# Patient Record
Sex: Male | Born: 2000 | Race: White | Hispanic: No | Marital: Single | Smoking: Never smoker
Health system: Southern US, Community
[De-identification: ages and names within clinical notes are randomized; demographics above are authoritative.]

## PROBLEM LIST (undated history)

## (undated) DIAGNOSIS — F840 Autistic disorder: Secondary | ICD-10-CM

## (undated) DIAGNOSIS — J302 Other seasonal allergic rhinitis: Secondary | ICD-10-CM

## (undated) DIAGNOSIS — F902 Attention-deficit hyperactivity disorder, combined type: Secondary | ICD-10-CM

## (undated) HISTORY — DX: Attention-deficit hyperactivity disorder, combined type: F90.2

## (undated) HISTORY — DX: Autistic disorder: F84.0

## (undated) HISTORY — DX: Other seasonal allergic rhinitis: J30.2

---
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
## 2019-01-11 DIAGNOSIS — L700 Acne vulgaris: Secondary | ICD-10-CM | POA: Diagnosis not present

## 2019-01-13 ENCOUNTER — Other Ambulatory Visit: Payer: Self-pay

## 2019-01-13 NOTE — Telephone Encounter (Signed)
pts mom left v/m (DPR signed) requesting refill vyvanse 70mg    Name of Pharmacy: [facility] [address] Last Fill or Written Date and Quantity: # 30 on 12/14/18 Last Office Visit and Type: 09/04/18 ADHD Next Office Visit and Type:none scheduled  Last Controlled Substance Agreement Date: 02/23/18 Last UDS:02/18/18

## 2019-01-14 MED ORDER — VYVANSE 70 MG PO CAPS: 70.0000 mg | ORAL_CAPSULE | Freq: Every day | ORAL | 0 refills | Status: DC

## 2019-01-14 NOTE — Telephone Encounter (Signed)
Eprescribed.

## 2019-02-10 ENCOUNTER — Other Ambulatory Visit: Payer: Self-pay

## 2019-02-10 NOTE — Telephone Encounter (Signed)
Name of Medication: Vyvanse 70 mg Name of Pharmacy:[facility]  Last Fill or Written Date and Quantity:# 32 on 01/14/19  Last Office Visit and Type:09/04/18 ADHD  Next Office Visit and Type: none scheduled Last Controlled Substance Agreement Date: 02/23/18 Last UDS:02/18/2018

## 2019-02-11 MED ORDER — VYVANSE 70 MG PO CAPS: 70.0000 mg | ORAL_CAPSULE | Freq: Every day | ORAL | 0 refills | Status: DC

## 2019-02-11 NOTE — Telephone Encounter (Signed)
Eprescribed.

## 2019-03-17 ENCOUNTER — Other Ambulatory Visit: Payer: Self-pay

## 2019-03-17 NOTE — Telephone Encounter (Signed)
Name of Medication:adderall 20 mg  Name of Pharmacy: [facility] Last Fill or Written Date and Quantity:# 30 on 11/12/18  Last Office Visit and Type: 09/04/18 ADHD Next Office Visit and Type: none Last Controlled Substance Agreement Date:02/23/2018  Last UDS:02/18/2018   Name of Medication : vyvanse 70 mg  Name of [facility]acy: [facility] Last Fill or Written Date and Quantity:# 30 on 02/11/19  Last Office Visit and Type: 09/04/18 ADHD Next Office Visit and Type: none Last Controlled Substance Agreement Date:02/23/2018  Last UDS:02/18/2018

## 2019-03-19 MED ORDER — VYVANSE 70 MG PO CAPS: 70.0000 mg | ORAL_CAPSULE | Freq: Every day | ORAL | 0 refills | Status: DC

## 2019-03-19 MED ORDER — AMPHETAMINE-DEXTROAMPHETAMINE 20 MG PO TABS: 20.0000 mg | ORAL_TABLET | Freq: Every day | ORAL | 0 refills | Status: DC

## 2019-03-19 NOTE — Telephone Encounter (Signed)
Pt's mom called requesting update on this. States patient goes back to school on Tuesday and he is out of medications.

## 2019-03-19 NOTE — Telephone Encounter (Signed)
Best number [phone] Hinton Dyer (mom) called checking on refill Pt is out of meds and starts school next week  [facility] [address]

## 2019-03-19 NOTE — Telephone Encounter (Signed)
Please call mom when this has been called

## 2019-03-19 NOTE — Telephone Encounter (Signed)
Eprescribed.

## 2019-04-19 ENCOUNTER — Other Ambulatory Visit: Payer: Self-pay | Admitting: *Deleted

## 2019-04-19 NOTE — Telephone Encounter (Signed)
Patient's mom left a voicemail requesting refills   Name of Medication: Adderall, Vyvanse Name of Pharmacy: [facility] [place] or Written Date and Quantity: both 03/19/19 #30 Last Office Visit and Type: 09/04/18 Next Office Visit and Type: None scheduled Last Controlled Substance Agreement Date: 02/23/18 Last UDS:02/18/18

## 2019-04-21 MED ORDER — AMPHETAMINE-DEXTROAMPHETAMINE 20 MG PO TABS: 20.0000 mg | ORAL_TABLET | Freq: Every day | ORAL | 0 refills | Status: DC

## 2019-04-21 MED ORDER — VYVANSE 70 MG PO CAPS: 70.0000 mg | ORAL_CAPSULE | Freq: Every day | ORAL | 0 refills | Status: DC

## 2019-04-21 NOTE — Telephone Encounter (Signed)
ERx 

## 2019-05-17 ENCOUNTER — Other Ambulatory Visit: Payer: Self-pay | Admitting: *Deleted

## 2019-05-17 NOTE — Telephone Encounter (Signed)
Patient's mom left a voicemail requesting a refill on Vyvanne.  Name of Medication: Vyvanne Name of Pharmacy: [facility] or Written Date and Quantity: 04/21/19 #30 Last Office Visit and Type: 09/04/18 Next Office Visit and Type: None scheduled Last Controlled Substance Agreement Date: 02/23/18 Last UDS: 02/18/18

## 2019-05-19 MED ORDER — AMPHETAMINE-DEXTROAMPHETAMINE 20 MG PO TABS: 20.0000 mg | ORAL_TABLET | Freq: Every day | ORAL | 0 refills | Status: DC

## 2019-05-19 NOTE — Telephone Encounter (Signed)
ERx 

## 2019-05-21 ENCOUNTER — Other Ambulatory Visit: Payer: Self-pay

## 2019-05-21 MED ORDER — VYVANSE 70 MG PO CAPS: 70.0000 mg | ORAL_CAPSULE | Freq: Every day | ORAL | 0 refills | Status: DC

## 2019-05-21 NOTE — Telephone Encounter (Signed)
ERx 

## 2019-05-21 NOTE — Telephone Encounter (Signed)
Name of Medication: Vyvanse Name of Pharmacy: [facility] Last Fill or Written Date and Quantity: 04/21/19, #30 Last Office Visit and Type: 09/04/18, ADHD f/u Next Office Visit and Type: none Last Controlled Substance Agreement Date: 02/21/18 Last UDS: 02/21/18

## 2019-06-02 DIAGNOSIS — Z20828 Contact with and (suspected) exposure to other viral communicable diseases: Secondary | ICD-10-CM | POA: Diagnosis not present

## 2019-06-18 ENCOUNTER — Other Ambulatory Visit: Payer: Self-pay

## 2019-06-18 MED ORDER — VYVANSE 70 MG PO CAPS: 70.0000 mg | ORAL_CAPSULE | Freq: Every day | ORAL | 0 refills | Status: DC

## 2019-06-18 NOTE — Telephone Encounter (Signed)
Name of Medication:Vyvanse 70 mg  Name of Pharmacy: [facility] or Written Date and Quantity: # 30 on 05/21/19 Last Office Visit and Type: 09/04/18 ADHD Next Office Visit and Type: none scheduled Last Controlled Substance Agreement Date: 02/23/2018 Last UDS:02/18/2018

## 2019-06-18 NOTE — Telephone Encounter (Signed)
ERx 

## 2019-07-12 ENCOUNTER — Other Ambulatory Visit: Payer: Self-pay

## 2019-07-12 NOTE — Telephone Encounter (Signed)
Name of Medication: Vyvanse 70 mg Name of Pharmacy: [facility] Last Fill or Written Date and Quantity: # 30 on 06/18/19 Last Office Visit and Type: 09/04/18 ADHD Next Office Visit and Type: none scheduled Last Controlled Substance Agreement Date: 02/23/2018 Last UDS:02/18/2018

## 2019-07-13 MED ORDER — VYVANSE 70 MG PO CAPS: 70.0000 mg | ORAL_CAPSULE | Freq: Every day | ORAL | 0 refills | Status: DC

## 2019-07-13 NOTE — Telephone Encounter (Signed)
ERx 

## 2019-08-16 ENCOUNTER — Other Ambulatory Visit: Payer: Self-pay | Admitting: *Deleted

## 2019-08-16 NOTE — Telephone Encounter (Signed)
Patient's mom left a voicemail requesting a refill  Name of Medication: Vyvanse Name of Pharmacy: [facility] Last Fill or Written Date and Quantity: 07/13/19 #30 Last Office Visit and Type: 09/04/18 Next Office Visit and Type: none scheduled Last Controlled Substance Agreement Date: 02/23/18 Last UDS:02/18/18

## 2019-08-18 MED ORDER — VYVANSE 70 MG PO CAPS: 70.0000 mg | ORAL_CAPSULE | Freq: Every day | ORAL | 0 refills | Status: DC

## 2019-08-18 NOTE — Telephone Encounter (Signed)
ERx 

## 2019-09-20 ENCOUNTER — Other Ambulatory Visit: Payer: Self-pay | Admitting: *Deleted

## 2019-09-20 MED ORDER — VYVANSE 70 MG PO CAPS: 70.0000 mg | ORAL_CAPSULE | Freq: Every day | ORAL | 0 refills | Status: DC

## 2019-09-20 NOTE — Telephone Encounter (Signed)
ERx 

## 2019-09-20 NOTE — Telephone Encounter (Signed)
Patient's mom left a voicemail requesting a refill on Vyvanse Name of Medication: Vyvanse Name of Pharmacy: [facility] or Written Date and Quantity: 08/18/19 Last Office Visit and Type: 09/04/18 Next Office Visit and Type: none scheduled Last Controlled Substance Agreement Date: 02/23/18 Last UDS:02/18/18

## 2019-10-22 ENCOUNTER — Other Ambulatory Visit: Payer: Self-pay

## 2019-10-22 MED ORDER — VYVANSE 70 MG PO CAPS: 70.0000 mg | ORAL_CAPSULE | Freq: Every day | ORAL | 0 refills | Status: DC

## 2019-10-22 MED ORDER — AMPHETAMINE-DEXTROAMPHETAMINE 20 MG PO TABS: 20.0000 mg | ORAL_TABLET | Freq: Every day | ORAL | 0 refills | Status: DC

## 2019-10-22 NOTE — Telephone Encounter (Signed)
Name of Medication: Vyvanse 70 mg [facility] of [facility]  Last Fill or Written Date and Quantity:#30 on 09/20/19  Last Office Visit and Type: 09/05/19 ADHD Next Office Visit and Type: none scheduled Last Controlled Substance Agreement Date: 02/23/2018 Last UDS:02/18/2018   Name of Medication: Adderall 20 mg [facility]  Last Fill or Written Date and Quantity:#30 on 05/19/19  Last Office Visit and Type: 09/05/19 ADHD Next Office Visit and Type: none scheduled Last Controlled Substance Agreement Date: 02/23/2018 Last UDS:02/18/2018  pts mom (DPR signed) left v/m pt has couple of pills left.

## 2019-10-22 NOTE — Telephone Encounter (Signed)
ERx 

## 2019-11-22 ENCOUNTER — Other Ambulatory Visit: Payer: Self-pay

## 2019-11-22 MED ORDER — VYVANSE 70 MG PO CAPS: 70.0000 mg | ORAL_CAPSULE | Freq: Every day | ORAL | 0 refills | Status: DC

## 2019-11-22 NOTE — Telephone Encounter (Signed)
Name of Medication: Vyvanse 70 mg Name [facility]  Last Fill or Written Date and Quantity: # 30 on 10/22/19 Last Office Visit and Type: 09/04/2018 ADHD Next Office Visit and Type: none scheduled Last Controlled Substance Agreement Date: 02/23/2018 Last UDS:02/18/2018

## 2019-11-22 NOTE — Telephone Encounter (Signed)
ERx 

## 2019-11-29 ENCOUNTER — Encounter: Payer: Self-pay | Admitting: Podiatry

## 2019-11-29 ENCOUNTER — Other Ambulatory Visit: Payer: Self-pay | Admitting: Podiatry

## 2019-11-29 ENCOUNTER — Other Ambulatory Visit: Payer: Self-pay

## 2019-11-29 ENCOUNTER — Ambulatory Visit: Payer: BC Managed Care – PPO | Admitting: Podiatry

## 2019-11-29 ENCOUNTER — Ambulatory Visit (INDEPENDENT_AMBULATORY_CARE_PROVIDER_SITE_OTHER): Payer: BC Managed Care – PPO

## 2019-11-29 VITALS — Temp 98.2°F

## 2019-11-29 DIAGNOSIS — M2041 Other hammer toe(s) (acquired), right foot: Secondary | ICD-10-CM

## 2019-11-29 DIAGNOSIS — M79675 Pain in left toe(s): Secondary | ICD-10-CM | POA: Diagnosis not present

## 2019-11-29 DIAGNOSIS — D169 Benign neoplasm of bone and articular cartilage, unspecified: Secondary | ICD-10-CM

## 2019-11-29 DIAGNOSIS — M79674 Pain in right toe(s): Secondary | ICD-10-CM

## 2019-11-29 NOTE — Patient Instructions (Signed)
Pre-Operative Instructions  Congratulations, you have decided to take an important step towards improving your quality of life.  You can be assured that the doctors and staff at [facility] will be with you every step of the way.  Here are some important things you should know:  1. Plan to be at the surgery center/hospital at least 1 (one) hour prior to your scheduled time, unless otherwise directed by the surgical center/hospital staff.  You must have a responsible adult accompany you, remain during the surgery and drive you home.  Make sure you have directions to the surgical center/hospital to ensure you arrive on time. 2. If you are having surgery at [facility] or [facility], you will need a copy of your medical history and physical form from your family physician within one month prior to the date of surgery. We will give you a form for your primary physician to complete.  3. We make every effort to accommodate the date you request for surgery.  However, there are times where surgery dates or times have to be moved.  We will contact you as soon as possible if a change in schedule is required.   4. No aspirin/ibuprofen for one week before surgery.  If you are on aspirin, any non-steroidal anti-inflammatory medications (Mobic, Aleve, Ibuprofen) should not be taken seven (7) days prior to your surgery.  You make take Tylenol for pain prior to surgery.  5. Medications - If you are taking daily heart and blood pressure medications, seizure, reflux, allergy, asthma, anxiety, pain or diabetes medications, make sure you notify the surgery center/hospital before the day of surgery so they can tell you which medications you should take or avoid the day of surgery. 6. No food or drink after midnight the night before surgery unless directed otherwise by surgical center/hospital staff. 7. No alcoholic beverages 24-hours prior to surgery.  No smoking 24-hours prior or 24-hours after  surgery. 8. Wear loose pants or shorts. They should be loose enough to fit over bandages, boots, and casts. 9. Don't wear slip-on shoes. Sneakers are preferred. 10. Bring your boot with you to the surgery center/hospital.  Also bring crutches or a walker if your physician has prescribed it for you.  If you do not have this equipment, it will be provided for you after surgery. 11. If you have not been contacted by the surgery center/hospital by the day before your surgery, call to confirm the date and time of your surgery. 12. Leave-time from work may vary depending on the type of surgery you have.  Appropriate arrangements should be made prior to surgery with your employer. 13. Prescriptions will be provided immediately following surgery by your doctor.  Fill these as soon as possible after surgery and take the medication as directed. Pain medications will not be refilled on weekends and must be approved by the doctor. 14. Remove nail polish on the operative foot and avoid getting pedicures prior to surgery. 15. Wash the night before surgery.  The night before surgery wash the foot and leg well with water and the antibacterial soap provided. Be sure to pay special attention to beneath the toenails and in between the toes.  Wash for at least three (3) minutes. Rinse thoroughly with water and dry well with a towel.  Perform this wash unless told not to do so by your physician.  Enclosed: 1 Ice pack (please put in freezer the night before surgery)   1 Hibiclens skin cleaner     Pre-op instructions  If you have any questions regarding the instructions, please do not hesitate to call our office.  [employer]: [address], [place], [place] [address] -- [phone]  [place]: [address] [place], [place] [address] -- [phone]  [place]: [address], [place], [place] [address] -- [phone]   Website: [email or web address] 

## 2019-12-01 ENCOUNTER — Telehealth: Payer: Self-pay

## 2019-12-01 NOTE — Progress Notes (Signed)
Subjective:   Patient ID: Isaiah Benjamin, male   DOB: 19 y.o.   MRN: [id number]   HPI Patient presents with mother stating that he has several other toes that are really bothering him and the one we did surgery on is doing very well and he needs to have these other toes corrected like the other ones.  He states he is tried to trim them he is tried wider shoes and soaks without relief   Review of Systems  All other systems reviewed and are negative.       Objective:  Physical Exam Vitals and nursing note reviewed.  Constitutional:      Appearance: He is well-developed.  Pulmonary:     Effort: Pulmonary effort is normal.  Musculoskeletal:        General: Normal range of motion.  Skin:    General: Skin is warm.  Neurological:     Mental Status: He is alert.     Neurovascular status intact muscle strength adequate range of motion within normal limits.  Patient is found to have severe rotation digit for bilateral at the distal joint and distal rotation digit 5 right with exostotic distal lateral lesion that is painful when pressed.  Patient is found to have good digital perfusion well oriented x3     Assessment:  Chronic hammertoe deformity fourth bilateral fifth right with the fifth left having been fixed a number of years ago     Plan:  H&P reviewed conditions discussed at great length.  I recommended distal arthroplasties of 4 bilateral 5 right along with distal lateral exostectomy digit 5 right and I reviewed conditions and they are comfortable with procedure and want to get it done in 1 to review consent form today to get it done as soon as possible.  I allowed him to read consent form going over alternative treatments complications and they understand risk of there is no guarantee this will solve the problem and all other complications as outlined and patient is scheduled for outpatient surgery and is encouraged to call with questions concerns prior to procedure.   Patient is given all education and is scheduled for the surgery at this time  X-rays indicate significant rotation of the digits with distal deformity of digits 4 and 5 right 4 left

## 2019-12-01 NOTE — Telephone Encounter (Signed)
DOS 07/16/2019  HAMMERTOE REPAIR 4,5 RT & 4 LT - 28285 EXOSTECTOMY 5TH RT - 28108  [employer] EFFECTIVE DATE - 07/16/2019    In-Network   Max Per Benefit Period Year-to-Date Remaining     CoInsurance 30%       Deductible $2500.00 $0.00     Out-Of-Pocket 3 $8550.00 [id number]  Copay Not Applicable Coinsurance [id number]  per  Service Year Authorization Required No

## 2019-12-14 ENCOUNTER — Encounter: Payer: Self-pay | Admitting: Podiatry

## 2019-12-14 DIAGNOSIS — D1631 Benign neoplasm of short bones of right lower limb: Secondary | ICD-10-CM | POA: Diagnosis not present

## 2019-12-14 DIAGNOSIS — Z01818 Encounter for other preprocedural examination: Secondary | ICD-10-CM | POA: Diagnosis not present

## 2019-12-14 DIAGNOSIS — M2042 Other hammer toe(s) (acquired), left foot: Secondary | ICD-10-CM | POA: Diagnosis not present

## 2019-12-14 DIAGNOSIS — M898X7 Other specified disorders of bone, ankle and foot: Secondary | ICD-10-CM | POA: Diagnosis not present

## 2019-12-14 DIAGNOSIS — M2041 Other hammer toe(s) (acquired), right foot: Secondary | ICD-10-CM | POA: Diagnosis not present

## 2019-12-14 HISTORY — PX: HAMMER TOE SURGERY: SHX385

## 2019-12-20 ENCOUNTER — Other Ambulatory Visit: Payer: Self-pay | Admitting: *Deleted

## 2019-12-20 NOTE — Telephone Encounter (Signed)
Mother left message at Triage requesting refill of med:  Name of Medication: Vyvanse 70 mg Name [facility]  Last Fill or Written Date and Quantity: # 30 on 11/22/19 Last Office Visit and Type: 09/04/2018 ADHD Next Office Visit and Type: none scheduled Last Controlled Substance Agreement Date: 02/23/2018 Last UDS:02/18/2018

## 2019-12-21 MED ORDER — VYVANSE 70 MG PO CAPS: 70.0000 mg | ORAL_CAPSULE | Freq: Every day | ORAL | 0 refills | Status: DC

## 2019-12-21 NOTE — Telephone Encounter (Signed)
ERx 

## 2019-12-22 ENCOUNTER — Ambulatory Visit (INDEPENDENT_AMBULATORY_CARE_PROVIDER_SITE_OTHER): Payer: BC Managed Care – PPO

## 2019-12-22 ENCOUNTER — Other Ambulatory Visit: Payer: Self-pay

## 2019-12-22 ENCOUNTER — Ambulatory Visit (INDEPENDENT_AMBULATORY_CARE_PROVIDER_SITE_OTHER): Payer: BC Managed Care – PPO | Admitting: Podiatry

## 2019-12-22 ENCOUNTER — Other Ambulatory Visit: Payer: Self-pay | Admitting: Podiatry

## 2019-12-22 ENCOUNTER — Encounter: Payer: Self-pay | Admitting: Podiatry

## 2019-12-22 VITALS — Temp 97.3°F

## 2019-12-22 DIAGNOSIS — M2041 Other hammer toe(s) (acquired), right foot: Secondary | ICD-10-CM

## 2019-12-22 DIAGNOSIS — M2042 Other hammer toe(s) (acquired), left foot: Secondary | ICD-10-CM

## 2019-12-22 NOTE — Progress Notes (Signed)
Subjective:   Patient ID: Isaiah Benjamin, male   DOB: 19 y.o.   MRN: [id number]   HPI Patient presents stating he is doing very well with minimal discomfort and overall very pleased with results and presents with father today   ROS      Objective:  Physical Exam  Neurovascular status intact negative Bevelyn Buckles' sign noted wound edges well coapted in all digits 4 5 right 4 left with good alignment noted and good structural integrity with no drainage      Assessment:  Doing well post digital surgery 4 5 right 4 left     Plan:  H&P reviewed condition and reapplied sterile dressing reviewed x-rays and advised on continued elevation compression immobilization reappoint 2 weeks suture removal or earlier if needed  X-rays indicate satisfactory resection of bone no indications of pathology with good alignment of the digits noted

## 2020-01-10 ENCOUNTER — Other Ambulatory Visit: Payer: Self-pay

## 2020-01-10 ENCOUNTER — Ambulatory Visit (INDEPENDENT_AMBULATORY_CARE_PROVIDER_SITE_OTHER): Payer: BC Managed Care – PPO | Admitting: Podiatrist

## 2020-01-10 ENCOUNTER — Ambulatory Visit (INDEPENDENT_AMBULATORY_CARE_PROVIDER_SITE_OTHER): Payer: BC Managed Care – PPO

## 2020-01-10 ENCOUNTER — Encounter: Payer: Self-pay | Admitting: Podiatrist

## 2020-01-10 VITALS — Temp 95.2°F

## 2020-01-10 DIAGNOSIS — M2041 Other hammer toe(s) (acquired), right foot: Secondary | ICD-10-CM

## 2020-01-10 DIAGNOSIS — M2042 Other hammer toe(s) (acquired), left foot: Secondary | ICD-10-CM

## 2020-01-10 NOTE — Progress Notes (Signed)
  Chief Complaint  Patient presents with  . Routine Post Op    DOS 12/14/2019 - bilateral hammertoes. Pt stated, "Doing well. Still no pain. I've had more bruising in the R 5th toe. No bleeding. No injuries".     Subjective: Patient presents today1 month status post foot surgery of on both feet-  Hammertoe correction 4 bilateral and hammertoe with distal exostectomy-,5 right.   Patient denies nausea, vomiting, fevers, chills or night sweats.  Denies calf pain or tenderness. States he has had no pain and he is wearing his surgical shoes.  He presents with his father at todays visit.   Objective:  Neurovascular status is intact with palpable pedal pulses DP and PT at 2+ out of 4 both feet.  Negative homans sign noted.  Neurological sensation is intact and unchanged  Excellent appearance of the hammertoe repair x 3 is noted.  Incision sites are well coapted with sutures noted to be in place.  Dried hemorrhagic superficial tissue at the tip of the right fifth digit is stable and appears to be sloughing off with healthy, intact skin beneath.   Xrays show excellent alignment and position of hammertoe correction bilateral feet.    Assessment: Status post hammertoe correction bilateral dos 12/14/19  Plan:  Sutures removed today and he was instructed he may slowly wean out of the surgical shoe to supportive sneakers as tolerated.  Do not go barefoot or wear flip flops.  He was instructed to apply a dab of vaseline to the incision lines to help the keep the skin moist.  He will return in 4 weeks and will call sooner if questions arise.

## 2020-01-19 ENCOUNTER — Encounter: Payer: Self-pay | Admitting: Podiatry

## 2020-01-25 ENCOUNTER — Other Ambulatory Visit: Payer: Self-pay

## 2020-01-25 NOTE — Telephone Encounter (Signed)
Name of Medication: Vyvanse 70 mg Name of [facility]acy[facility]  Last Fill or Written Date and Quantity:# 30 on 12/21/19  Last Office Visit and Type: 09/04/18 ADHD Next Office Visit and Type:none scheduled

## 2020-01-28 ENCOUNTER — Telehealth: Payer: Self-pay

## 2020-01-28 MED ORDER — VYVANSE 70 MG PO CAPS: 70.0000 mg | ORAL_CAPSULE | Freq: Every day | ORAL | 0 refills | Status: DC

## 2020-01-28 NOTE — Telephone Encounter (Signed)
Patient mom came into office needing a refill on patients medication was told it was sent to pharmacy 2 days ago but wasn't  Please advise   VYVANSE 70 MG capsule

## 2020-01-28 NOTE — Telephone Encounter (Signed)
See Refill, 01/25/20.

## 2020-01-28 NOTE — Telephone Encounter (Signed)
ERx 

## 2020-01-28 NOTE — Telephone Encounter (Signed)
plz notify this was sent in. 

## 2020-01-31 NOTE — Telephone Encounter (Signed)
Spoke with pt's dad, Jeneen Rinks (on dpr), notifying him rx was sent to pharmacy.  Says they picked it up last week.

## 2020-02-02 ENCOUNTER — Other Ambulatory Visit: Payer: Self-pay

## 2020-02-02 ENCOUNTER — Ambulatory Visit (INDEPENDENT_AMBULATORY_CARE_PROVIDER_SITE_OTHER): Payer: BC Managed Care – PPO | Admitting: Podiatry

## 2020-02-02 ENCOUNTER — Encounter: Payer: Self-pay | Admitting: Podiatry

## 2020-02-02 ENCOUNTER — Ambulatory Visit (INDEPENDENT_AMBULATORY_CARE_PROVIDER_SITE_OTHER): Payer: BC Managed Care – PPO

## 2020-02-02 DIAGNOSIS — M2042 Other hammer toe(s) (acquired), left foot: Secondary | ICD-10-CM

## 2020-02-02 DIAGNOSIS — M2041 Other hammer toe(s) (acquired), right foot: Secondary | ICD-10-CM | POA: Diagnosis not present

## 2020-02-03 NOTE — Progress Notes (Signed)
Subjective:   Patient ID: Isaiah Benjamin, male   DOB: 19 y.o.   MRN: [id number]   HPI Patient states doing very well with surgery very happy and presents with father   ROS      Objective:  Physical Exam  Neurovascular status intact muscle strength adequate range of motion within normal limits with patient found to have good digital position and is found to have keratotic lesions which have resolved bilateral with no current pain mild swelling     Assessment:  Doing well post digital procedures fourth and fifth digits bilateral     Plan:  Final x-rays reviewed discussed return to normal shoe gear and activities but swelling should persist for another few months and is in normal part of the healing process  X-rays indicate satisfactory resection of bone no indications of regrowth or pathology

## 2020-02-21 ENCOUNTER — Other Ambulatory Visit: Payer: Self-pay

## 2020-02-21 NOTE — Telephone Encounter (Signed)
Name of Medication: Vyvanse 70 mg Name [facility]  Last Fill or Written Date and Quantity: # 30 on 01/28/20 Last Office Visit and Type: 09/04/2018 ADHD Next Office Visit and Type:none scheduled  Last Controlled Substance Agreement Date: 02/23/2018 Last UDS:02/18/2018

## 2020-02-22 MED ORDER — VYVANSE 70 MG PO CAPS: 70.0000 mg | ORAL_CAPSULE | Freq: Every day | ORAL | 0 refills | Status: DC

## 2020-02-22 NOTE — Telephone Encounter (Signed)
ERx 

## 2020-03-27 ENCOUNTER — Other Ambulatory Visit: Payer: Self-pay

## 2020-03-27 MED ORDER — VYVANSE 70 MG PO CAPS: 70.0000 mg | ORAL_CAPSULE | Freq: Every day | ORAL | 0 refills | Status: DC

## 2020-03-27 NOTE — Telephone Encounter (Signed)
Patient's mother contacted the office stating that patient needs a refill on his Vyvanse 70mg . Last refilled 02/22/20 for #30 with 0 refills.  Patient was last seen 09/04/18. OK to refill?

## 2020-03-27 NOTE — Telephone Encounter (Signed)
Erx

## 2020-04-07 DIAGNOSIS — H5213 Myopia, bilateral: Secondary | ICD-10-CM | POA: Diagnosis not present

## 2020-04-24 ENCOUNTER — Telehealth: Payer: Self-pay

## 2020-04-24 NOTE — Telephone Encounter (Signed)
Letter written and in chart 

## 2020-04-24 NOTE — Telephone Encounter (Signed)
Pt's mom called and left a message on triage on triage line asking if Dr Darnell Level would be willing to write a letter for the pt for [employer]. He received a summons. He has Autism and ADHD. Please advise at [phone]

## 2020-04-25 NOTE — Telephone Encounter (Signed)
Spoke with pt's dad, Jeneen Rinks (on dpr), notifying him the letter is ready to pick up.  He expresses his thanks and will pick up today.   [Printed and placed letter at front office- yellow folders.]

## 2020-04-27 ENCOUNTER — Other Ambulatory Visit: Payer: Self-pay

## 2020-04-27 MED ORDER — VYVANSE 70 MG PO CAPS: 70.0000 mg | ORAL_CAPSULE | Freq: Every day | ORAL | 0 refills | Status: DC

## 2020-04-27 NOTE — Telephone Encounter (Signed)
Name of Medication: Vyvanse Name of Pharmacy: [facility] [address]  Last Fill or Written Date and Quantity: 03/27/20, #30 Last Office Visit and Type: 09/04/18, ADHD f/u Next Office Visit and Type: none Last Controlled Substance Agreement Date: 02/21/18 Last UDS: 02/21/18

## 2020-04-27 NOTE — Telephone Encounter (Signed)
ERx 

## 2020-05-19 ENCOUNTER — Encounter: Payer: Self-pay | Admitting: Family Medicine

## 2020-05-19 ENCOUNTER — Ambulatory Visit (INDEPENDENT_AMBULATORY_CARE_PROVIDER_SITE_OTHER): Payer: BC Managed Care – PPO | Admitting: Family Medicine

## 2020-05-19 ENCOUNTER — Other Ambulatory Visit: Payer: Self-pay

## 2020-05-19 VITALS — BP 110/60 | HR 87 | Temp 98.1°F | Ht 67.0 in | Wt 129.0 lb

## 2020-05-19 DIAGNOSIS — F909 Attention-deficit hyperactivity disorder, unspecified type: Secondary | ICD-10-CM

## 2020-05-19 DIAGNOSIS — F840 Autistic disorder: Secondary | ICD-10-CM

## 2020-05-19 MED ORDER — AMPHETAMINE-DEXTROAMPHETAMINE 20 MG PO TABS: 20.0000 mg | ORAL_TABLET | Freq: Every day | ORAL | 0 refills | Status: DC

## 2020-05-19 NOTE — Assessment & Plan Note (Addendum)
Discussed stimulant dosing. On max dose vyvanse. he is taking vyvanse too early in the morning (6am) leading to early loss of effect. Suggested delay am administration until closer to 8am, will refill adderall IR 20mg  daily to use PRN afternoons.  RTC 1 yr f/u visit.

## 2020-05-19 NOTE — Progress Notes (Signed)
This visit was conducted in person.  BP 110/60 (BP Location: Left Arm, Patient Position: Sitting, Cuff Size: Normal)   Pulse 87   Temp 98.1 F (36.7 C)   Ht 5\' 7"  (1.702 m)   Wt 129 lb (58.5 kg)   SpO2 98%   BMI 20.20 kg/m    CC: ADHD f/u Subjective:    Patient ID: Isaiah Benjamin, male    DOB: 07/05/2001, 19 y.o.   MRN: [id number]  HPI: Isaiah Benjamin is a 19 y.o. male presenting on 05/19/2020 for ADHD (Here with Mom. Here for follow-up. Has been on same med for a long time. Notricing issue in the afternoon getting homework done.)   Last seen 08/2018.  Graduated from [employer], now in [place]. Remains interested in aviation.   ADHD (psychological assessment completed 2006), longstanding on vyvanse 70mg  daily. Also on adderall 20mg  in PM PRN. Takes vyvanse at [employer], notes it wears off at 1pm. 2d/wk has class until 4pm, then in afternoons has online class.   Tolerating med well, without HA, chest pain, insomnia, anorexia, weight loss.   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      Relevant past medical, surgical, family and social history reviewed and updated as indicated. Interim medical history since our last visit reviewed. Allergies and medications reviewed and updated. Outpatient Medications Prior to Visit  Medication Sig Dispense Refill  . cetirizine (ZYRTEC) 10 MG tablet Take 10 mg by mouth daily as needed for allergies.    . Melatonin 5 MG CAPS Take 1 capsule by mouth daily as needed.    Marland Kitchen VYVANSE 70 MG capsule Take 1 capsule (70 mg total) by mouth daily. 30 capsule 0  . amphetamine-dextroamphetamine (ADDERALL) 20 MG tablet Take 1 tablet (20 mg total) by mouth daily. 30 tablet 0   No facility-administered medications prior to visit.     Per HPI unless specifically indicated in ROS section below Review of Systems Objective:  BP 110/60 (BP Location: Left Arm, Patient Position: Sitting, Cuff Size: Normal)   Pulse 87   Temp 98.1  F (36.7 C)   Ht 5\' 7"  (1.702 m)   Wt 129 lb (58.5 kg)   SpO2 98%   BMI 20.20 kg/m   Wt Readings from Last 3 Encounters:  05/19/20 129 lb (58.5 kg)  09/04/18 120 lb 8 oz (54.7 kg) (5 %, Z= -1.62)*  02/18/18 115 lb (52.2 kg) (3 %, Z= -1.87)*   * Growth percentiles are based on CDC (Boys, 2-20 Years) data.      Physical Exam Vitals and nursing note reviewed.  Constitutional:      Appearance: Normal appearance. He is not ill-appearing.  Cardiovascular:     Rate and Rhythm: Normal rate and regular rhythm.     Pulses: Normal pulses.     Heart sounds: Murmur (faint 2/6 systolic best at apex, noticeable with inspiration) heard.   Pulmonary:     Effort: Pulmonary effort is normal. No respiratory distress.     Breath sounds: Normal breath sounds. No wheezing, rhonchi or rales.  Neurological:     Mental Status: He is alert.  Psychiatric:        Mood and Affect: Mood normal.        Behavior: Behavior normal.       Assessment & Plan:  This visit occurred during the SARS-CoV-2 public health emergency.  Safety protocols were in place, including screening questions prior to the visit, additional usage of staff PPE,  and extensive cleaning of exam room while observing appropriate contact time as indicated for disinfecting solutions.   Problem List Items Addressed This Visit    Autism spectrum disorder    Doing well at [employer], enjoying aviation.       Attention deficit hyperactivity disorder (ADHD) - Primary    Discussed stimulant dosing. On max dose vyvanse. he is taking vyvanse too early in the morning (6am) leading to early loss of effect. Suggested delay am administration until closer to 8am, will refill adderall IR 20mg  daily to use PRN afternoons.  RTC 1 yr f/u visit.           Meds ordered this encounter  Medications  . amphetamine-dextroamphetamine (ADDERALL) 20 MG tablet    Sig: Take 1 tablet (20 mg total) by mouth daily.    Dispense:  30 tablet    Refill:  0   No  orders of the defined types were placed in this encounter.   Patient Instructions  Continue vyvanse 70mg  in the morning, try to take later in morning if able.  Continue adderall 20mg  for afternoon as needed.  Return as needed or in 1 year for next visit / physical.    Follow up plan: Return in about 1 year (around 05/19/2021) for annual exam.  Ria Bush, MD

## 2020-05-19 NOTE — Patient Instructions (Addendum)
Continue vyvanse 70mg  in the morning, try to take later in morning if able.  Continue adderall 20mg  for afternoon as needed.  Return as needed or in 1 year for next visit / physical.

## 2020-05-19 NOTE — Assessment & Plan Note (Signed)
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

## 2020-06-01 ENCOUNTER — Other Ambulatory Visit: Payer: Self-pay

## 2020-06-01 MED ORDER — VYVANSE 70 MG PO CAPS: 70.0000 mg | ORAL_CAPSULE | Freq: Every day | ORAL | 0 refills | Status: DC

## 2020-06-01 NOTE — Telephone Encounter (Signed)
ERx 

## 2020-06-01 NOTE — Telephone Encounter (Signed)
Last filled 04-27-20 #30 Last OV 05-19-20 No Future OV [facility] [address]

## 2020-07-10 ENCOUNTER — Other Ambulatory Visit: Payer: Self-pay

## 2020-07-10 MED ORDER — VYVANSE 70 MG PO CAPS: 70.0000 mg | ORAL_CAPSULE | Freq: Every day | ORAL | 0 refills | Status: DC

## 2020-07-10 MED ORDER — AMPHETAMINE-DEXTROAMPHETAMINE 20 MG PO TABS: 20.0000 mg | ORAL_TABLET | Freq: Every day | ORAL | 0 refills | Status: DC

## 2020-07-10 NOTE — Telephone Encounter (Signed)
ERx 

## 2020-07-10 NOTE — Telephone Encounter (Signed)
Name of Medication: Vyvanse Name of Pharmacy: [facility] [address]  Last Fill or Written Date and Quantity: 06/01/20, #30 Last Office Visit and Type: 05/19/20, ADHD f/u Next Office Visit and Type: none Last Controlled Substance Agreement Date: 02/21/18 Last UDS: 02/21/18   Adderall 20mg  #30 Last filled 05/19/2020

## 2020-08-03 ENCOUNTER — Other Ambulatory Visit: Payer: Self-pay

## 2020-08-03 NOTE — Telephone Encounter (Signed)
Vm left at triage.    Pt's mom requesting refills for pt.  Name of Medication: Adderall, Vyvanse Name of Pharmacy: [facility] [address] Last Fill or Written Date and Quantity: 07/10/21      Adderall- #30      Vyvanse- #30 Last Office Visit and Type: 05/19/20, ADHD f/u Next Office Visit and Type: none Last Controlled Substance Agreement Date: 02/21/18 Last UDS: 02/21/18

## 2020-08-04 MED ORDER — AMPHETAMINE-DEXTROAMPHETAMINE 20 MG PO TABS: 20.0000 mg | ORAL_TABLET | Freq: Every day | ORAL | 0 refills | Status: DC

## 2020-08-04 MED ORDER — VYVANSE 70 MG PO CAPS: 70.0000 mg | ORAL_CAPSULE | Freq: Every day | ORAL | 0 refills | Status: DC

## 2020-08-04 NOTE — Telephone Encounter (Signed)
ERx 

## 2020-08-10 ENCOUNTER — Telehealth: Payer: Self-pay

## 2020-08-10 NOTE — Telephone Encounter (Signed)
Patient's mom called stating that patient's insurance is not covering Vyvanse and they wanted to know what to do at this time. Can he try a different medication or take different dose of Adderall? Any recommendations? Pharmacy told mom about this issue and she was told that pharmacy faxed over this information last week but have not heard anything back.  Patient has about 2 tablets left of Vyvanse. Patient takes Adderrall 20 mg in the evening to help with homework.  CB number is the home number and patient's father, Isaiah Benjamin, is available there. If need to speak with mom, Isaiah Benjamin, she is at work but her number is [phone].

## 2020-08-11 NOTE — Telephone Encounter (Signed)
Dr. Darnell Level do you want me to submit a PA?  If so, can it be for generic?  Also, has the pt tried and failed any other meds for this dx?

## 2020-08-11 NOTE — Telephone Encounter (Signed)
Please submit PA for vyvanse, ok to be generic lisdexamfetamine.  Chronic regimen, works well for him.

## 2020-08-11 NOTE — Telephone Encounter (Signed)
Pt mom called back and stated that Dr. Darnell Level has to call the insurance to get it covered, and he only has 1 pill left

## 2020-08-15 NOTE — Telephone Encounter (Signed)
Submitted PA for Vyvanse 70 mg cap; key:  [id number].  Decision pending.

## 2020-08-17 ENCOUNTER — Telehealth: Payer: Self-pay | Admitting: Family Medicine

## 2020-08-17 MED ORDER — AMPHETAMINE-DEXTROAMPHET ER 20 MG PO CP24: 20.0000 mg | ORAL_CAPSULE | ORAL | 0 refills | Status: DC

## 2020-08-17 NOTE — Telephone Encounter (Signed)
This was addressed in previous phone note

## 2020-08-17 NOTE — Telephone Encounter (Signed)
Pharmacy is denying the prescription for the following. They need [place] to call and approve the medication before they will fill his prescription Medication vyvanse  Ph[facility]acy [facility] ( [place] [facility] [place]) Please advise. EM

## 2020-08-17 NOTE — Telephone Encounter (Signed)
Left message on vm for pt's mom, Hinton Dyer, per dpr relaying Dr. Synthia Innocent message.

## 2020-08-17 NOTE — Telephone Encounter (Signed)
See TE, 07/10/21.

## 2020-08-17 NOTE — Telephone Encounter (Signed)
plz notify mom - I have sent adderall XR to replace vyvanse. Start at 20mg  XR daily should have 8-12 hour effect, may take 20mg  Adderall IR in afternoons as up to now (but may not need).  Update [place] with effect.

## 2020-08-17 NOTE — Addendum Note (Signed)
Addended by: Ria Bush on: 08/17/2020 12:23 PM   Modules accepted: Orders

## 2020-08-17 NOTE — Telephone Encounter (Addendum)
Received faxed PA denial.  Reason:  Med is not on the formulary and is covered when ALL alternative meds on member's formulary have been tried and did not work or were not tolerated.  Alternative meds:   Amphetamine-dextroamphetamine XR (Adderall XR) cap Dexmethylphenidate HCl ER (Focalin XR) cap Dextroamphetamine ER (Dexedrine) cap Methylphenidate ER osmootic release (Concerta) Methylphenidate ER (Metdate CD) Methylphenidate LA (Ritalin LA) cap

## 2020-09-12 ENCOUNTER — Other Ambulatory Visit: Payer: Self-pay | Admitting: *Deleted

## 2020-09-12 NOTE — Telephone Encounter (Signed)
Patient's mom left a voicemail requesting refills  Adderall last refill 08/04/20 #30  Adderall XR last refill 08/17/20 #30 Last office visit 05/19/20

## 2020-09-13 MED ORDER — AMPHETAMINE-DEXTROAMPHETAMINE 20 MG PO TABS: 20.0000 mg | ORAL_TABLET | Freq: Every day | ORAL | 0 refills | Status: DC

## 2020-09-13 MED ORDER — AMPHETAMINE-DEXTROAMPHET ER 20 MG PO CP24: 20.0000 mg | ORAL_CAPSULE | ORAL | 0 refills | Status: DC

## 2020-09-13 NOTE — Telephone Encounter (Signed)
ERx 

## 2020-10-16 ENCOUNTER — Other Ambulatory Visit: Payer: Self-pay | Admitting: *Deleted

## 2020-10-16 MED ORDER — AMPHETAMINE-DEXTROAMPHET ER 20 MG PO CP24: 20.0000 mg | ORAL_CAPSULE | ORAL | 0 refills | Status: DC

## 2020-10-16 MED ORDER — AMPHETAMINE-DEXTROAMPHETAMINE 20 MG PO TABS: 20.0000 mg | ORAL_TABLET | Freq: Every day | ORAL | 0 refills | Status: DC

## 2020-10-16 NOTE — Telephone Encounter (Signed)
ERx 

## 2020-10-16 NOTE — Telephone Encounter (Signed)
Patient's mom left a voicemail stating that her son needs refills on his medications Adderall ER 20 mg, last refill 09/13/20 #30 Adderall 20 mg last refill 09/13/20 #30 Last office visit 05/19/20 Upcoming appointment none scheduled [facility]acy [facility]

## 2020-11-14 ENCOUNTER — Other Ambulatory Visit: Payer: Self-pay

## 2020-11-14 NOTE — Telephone Encounter (Signed)
Pharmacy requests refill on: Amphetamine-Dextroamphetamine 20 mg & Amphetamine-Dextroamphetamine 20 mg 24 hr   LAST REFILL: 10/16/2020 (Q-30, R-0) LAST OV: 05/29/2020 NEXT OV: Not Scheduled  PHARMACY: [id number] [place], [place]  UDS & Contract: 02/18/2018

## 2020-11-15 MED ORDER — AMPHETAMINE-DEXTROAMPHET ER 20 MG PO CP24: 20.0000 mg | ORAL_CAPSULE | ORAL | 0 refills | Status: DC

## 2020-11-15 MED ORDER — AMPHETAMINE-DEXTROAMPHETAMINE 20 MG PO TABS: 20.0000 mg | ORAL_TABLET | Freq: Every day | ORAL | 0 refills | Status: DC

## 2020-11-15 NOTE — Telephone Encounter (Signed)
ERx 

## 2020-12-26 ENCOUNTER — Other Ambulatory Visit: Payer: Self-pay

## 2020-12-26 NOTE — Telephone Encounter (Signed)
Pts mom (DPR signed) left v/m requesting refill amphetamine-D XR 20 mg to [facility] [address] [facility] [address].  Name of Medication: amphetamine-D  XR 20 mg [facility] [address] Last Fill or Written Date and Quantity: # 30 on 11/15/20 Last Office Visit and Type: 05/19/2020 ADHD Next Office Visit and Type:none scheduled  Last Controlled Substance Agreement Date: 02/23/2018 Last UDS:02/18/2018

## 2020-12-27 MED ORDER — AMPHETAMINE-DEXTROAMPHET ER 20 MG PO CP24: 20.0000 mg | ORAL_CAPSULE | ORAL | 0 refills | Status: DC

## 2020-12-27 NOTE — Telephone Encounter (Signed)
ERx 

## 2021-01-29 ENCOUNTER — Other Ambulatory Visit: Payer: Self-pay | Admitting: *Deleted

## 2021-01-29 MED ORDER — AMPHETAMINE-DEXTROAMPHET ER 20 MG PO CP24: 20.0000 mg | ORAL_CAPSULE | ORAL | 0 refills | Status: DC

## 2021-01-29 NOTE — Telephone Encounter (Signed)
ERx 

## 2021-01-29 NOTE — Telephone Encounter (Signed)
Patient's mom left a voicemail requesting a refill Name of Medication: Adderall XR 20 mg Name of Pharmacy: [place] or Written Date and Quantity: 12/27/20 #30 Last Office Visit and Type: 05/19/20 Next Office Visit and Type: none scheduled Last Controlled Substance Agreement Date: 02/23/18 Last UDS:02/18/18

## 2021-02-06 ENCOUNTER — Encounter: Payer: Self-pay | Admitting: Family Medicine

## 2021-02-06 ENCOUNTER — Other Ambulatory Visit: Payer: Self-pay

## 2021-02-06 ENCOUNTER — Ambulatory Visit (INDEPENDENT_AMBULATORY_CARE_PROVIDER_SITE_OTHER): Payer: BC Managed Care – PPO | Admitting: Family Medicine

## 2021-02-06 VITALS — BP 122/64 | HR 78 | Temp 98.3°F | Ht 67.0 in | Wt 137.0 lb

## 2021-02-06 DIAGNOSIS — F909 Attention-deficit hyperactivity disorder, unspecified type: Secondary | ICD-10-CM | POA: Diagnosis not present

## 2021-02-06 MED ORDER — METHYLPHENIDATE HCL ER (OSM) 36 MG PO TBCR: 36.0000 mg | EXTENDED_RELEASE_TABLET | Freq: Every day | ORAL | 0 refills | Status: DC

## 2021-02-06 NOTE — Progress Notes (Signed)
Patient ID: Isaiah Benjamin, male    DOB: 04-26-2001, 20 y.o.   MRN: [id number]  This visit was conducted in person.  BP 122/64   Pulse 78   Temp 98.3 F (36.8 C) (Temporal)   Ht '5\' 7"'$  (1.702 m)   Wt 137 lb (62.1 kg)   SpO2 96%   BMI 21.46 kg/m    CC: discuss meds Subjective:   HPI: Isaiah Benjamin is a 20 y.o. male presenting on 02/06/2021 for Discuss Medication (Wants to discuss Adderall.  Pt accompanied by dad [James], temp 97.8.)   Longstanding h/o ADHD, previously on vyvanse '70mg'$  daily + adderall '20mg'$  PRN in the pm's.  Psychological assessment completed 2006.  Back in 07/2020 insurance stopped covering vyvanse, Prior auth denied, reason:   Med is not on the formulary and is covered when ALL alternative meds on member's formulary have been tried and did not work or were not tolerated.  Alternative meds: Amphetamine-dextroamphetamine XR (Adderall XR) cap Dexmethylphenidate HCl ER (Focalin XR) cap Dextroamphetamine ER (Dexedrine) cap Methylphenidate ER osmootic release (Concerta) Methylphenidate ER (Metdate CD) Methylphenidate LA (Ritalin LA) cap  Working at [employer] - for last 3-4 years. 5pm-10pm - takes adderall XR every day even on weekends, takes adderall IR with him to work and takes only on work days.  Planning to start college back up end of august at [employer] - finished main studies in Nurse, mental health now needs to complete core curriculum. Has 1 more year of studies.  Adderall XR is not working well - ongoing inattention.  No trouble with side effects - notes increased hunger.  No chest pain, headaches, insomnia, tics, mood issues.  Notes new twitching since starting adderall XR.      Relevant past medical, surgical, family and social history reviewed and updated as indicated. Interim medical history since our last visit reviewed. Allergies and medications reviewed and updated. Outpatient Medications Prior to Visit  Medication Sig Dispense Refill    amphetamine-dextroamphetamine (ADDERALL) 20 MG tablet Take 1 tablet (20 mg total) by mouth daily. 30 tablet 0   cetirizine (ZYRTEC) 10 MG tablet Take 10 mg by mouth daily as needed for allergies.     Melatonin 5 MG CAPS Take 1 capsule by mouth daily as needed.     amphetamine-dextroamphetamine (ADDERALL XR) 20 MG 24 hr capsule Take 1 capsule (20 mg total) by mouth every morning. 30 capsule 0   No facility-administered medications prior to visit.     Per HPI unless specifically indicated in ROS section below Review of Systems  Objective:  BP 122/64   Pulse 78   Temp 98.3 F (36.8 C) (Temporal)   Ht '5\' 7"'$  (1.702 m)   Wt 137 lb (62.1 kg)   SpO2 96%   BMI 21.46 kg/m   Wt Readings from Last 3 Encounters:  02/06/21 137 lb (62.1 kg)  05/19/20 129 lb (58.5 kg)  09/04/18 120 lb 8 oz (54.7 kg) (5 %, Z= -1.62)*   * Growth percentiles are based on CDC (Boys, 2-20 Years) data.      Physical Exam Vitals and nursing note reviewed.  Constitutional:      Appearance: Normal appearance. He is not ill-appearing.  Cardiovascular:     Rate and Rhythm: Normal rate and regular rhythm.     Pulses: Normal pulses.     Heart sounds: Normal heart sounds. No murmur heard. Pulmonary:     Effort: Pulmonary effort is normal. No respiratory distress.  Breath sounds: Normal breath sounds. No wheezing, rhonchi or rales.  Musculoskeletal:     Right lower leg: No edema.     Left lower leg: No edema.  Skin:    General: Skin is warm and dry.     Findings: No rash.  Neurological:     Mental Status: He is alert.  Psychiatric:        Mood and Affect: Mood normal.        Behavior: Behavior normal.       Assessment & Plan:  This visit occurred during the SARS-CoV-2 public health emergency.  Safety protocols were in place, including screening questions prior to the visit, additional usage of staff PPE, and extensive cleaning of exam room while observing appropriate contact time as indicated for  disinfecting solutions.   Problem List Items Addressed This Visit     Attention deficit hyperactivity disorder (ADHD) - Primary    Discussed stimulants.  Vyvanse previously effective however insurance stopped covering.  Adderall XR '20mg'$  ineffective regarding attention and may be causing twitching - will stop this, trial concerta '36mg'$  daily. Advised update [place] in 1-2 wks with effect. Reviewed side effects to watch for.          Meds ordered this encounter  Medications   methylphenidate (CONCERTA) 36 MG PO CR tablet    Sig: Take 1 tablet (36 mg total) by mouth daily.    Dispense:  30 tablet    Refill:  0    In place of adderall XR    No orders of the defined types were placed in this encounter.    Patient Instructions  Stop extended release adderall XR  Start concerta 36 mg daily. May take adderall immediate release as needed  Update me with effect in 1-2 weeks.   Follow up plan: Return if symptoms worsen or fail to improve.  Ria Bush, MD

## 2021-02-06 NOTE — Assessment & Plan Note (Signed)
Discussed stimulants.  Vyvanse previously effective however insurance stopped covering.  Adderall XR '20mg'$  ineffective regarding attention and may be causing twitching - will stop this, trial concerta '36mg'$  daily. Advised update [place] in 1-2 wks with effect. Reviewed side effects to watch for.

## 2021-02-06 NOTE — Patient Instructions (Addendum)
Stop extended release adderall XR  Start concerta 36 mg daily. May take adderall immediate release as needed  Update me with effect in 1-2 weeks.

## 2021-02-08 ENCOUNTER — Telehealth: Payer: Self-pay | Admitting: Family Medicine

## 2021-02-08 NOTE — Telephone Encounter (Signed)
Pt came in office to drop off vaccination card. Placed in provider box

## 2021-02-08 NOTE — Telephone Encounter (Signed)
Updated pt's chart.  

## 2021-03-06 ENCOUNTER — Telehealth: Payer: Self-pay | Admitting: Family Medicine

## 2021-03-06 MED ORDER — METHYLPHENIDATE HCL ER (OSM) 36 MG PO TBCR: 36.0000 mg | EXTENDED_RELEASE_TABLET | Freq: Every day | ORAL | 0 refills | Status: DC

## 2021-03-06 NOTE — Telephone Encounter (Signed)
ERx 

## 2021-03-06 NOTE — Telephone Encounter (Signed)
Pt needs a refill on methylphenidate (CONCERTA) 36 MG PO CR tablet sent to [facility]

## 2021-04-26 ENCOUNTER — Telehealth: Payer: Self-pay | Admitting: Family Medicine

## 2021-04-26 DIAGNOSIS — F909 Attention-deficit hyperactivity disorder, unspecified type: Secondary | ICD-10-CM

## 2021-04-26 NOTE — Telephone Encounter (Signed)
  Encourage patient to contact the pharmacy for refills or they can request refills through [place]:  Please schedule appointment if longer than 1 year  NEXT APPOINTMENT DATE:  MEDICATION:methylphenidate  Is the patient out of medication? yes  [facility]: [facility] [place] [address] and [address]  Let patient know to contact pharmacy at the end of the day to make sure medication is ready.  Please notify patient to allow 48-72 hours to process  CLINICAL FILLS OUT ALL BELOW:   LAST REFILL:  QTY:  REFILL DATE:    OTHER COMMENTS:    Okay for refill?  Please advise

## 2021-04-27 MED ORDER — METHYLPHENIDATE HCL ER (OSM) 36 MG PO TBCR: 36.0000 mg | EXTENDED_RELEASE_TABLET | Freq: Every day | ORAL | 0 refills | Status: DC

## 2021-04-27 NOTE — Telephone Encounter (Signed)
Refill request Concerta Last refill 5/32/02 #30 Last office visit 02/06/21 No upcoming appointment scheduled. Last UDS 02/18/18

## 2021-04-27 NOTE — Telephone Encounter (Signed)
ERx 

## 2021-05-02 MED ORDER — METHYLPHENIDATE HCL ER (OSM) 36 MG PO TBCR: 36.0000 mg | EXTENDED_RELEASE_TABLET | Freq: Every day | ORAL | 0 refills | Status: DC

## 2021-05-02 NOTE — Addendum Note (Signed)
Addended by: Pleas Koch on: 05/02/2021 05:36 PM   Modules accepted: Orders

## 2021-05-02 NOTE — Telephone Encounter (Signed)
No suspicious activity noted on PMP aware web site. Last office visit is up-to-date. Refill sent to pharmacy.

## 2021-05-02 NOTE — Telephone Encounter (Signed)
Spoke with [facility] [address] asking if rx was received.  Pharmacist confirms it was not received on their end and I cannot leave refill info on vm b/c med is controlled.  Isaiah Benjamin says she will take care of refill.

## 2021-05-02 NOTE — Telephone Encounter (Signed)
Pt mother called she went to [facility] they told her they didn't have the rx for Concerta  He is out  Pt mother would like a call back at [phone]

## 2021-05-03 NOTE — Telephone Encounter (Signed)
Called and LM on moms VM that rx was sent in. Advised to call back if theres any issues with rx again.

## 2021-06-12 ENCOUNTER — Telehealth: Payer: Self-pay | Admitting: Family Medicine

## 2021-06-12 DIAGNOSIS — F909 Attention-deficit hyperactivity disorder, unspecified type: Secondary | ICD-10-CM

## 2021-06-12 NOTE — Telephone Encounter (Signed)
No upcoming appointment scheduled Last UDS 02/18/18

## 2021-06-12 NOTE — Telephone Encounter (Signed)
1.Medication Requested: methylphenidate (CONCERTA) 36 MG PO CR tablet  2. Pharmacy (Name, Street, [place]): [facility] [phone] - [place], [place]  3. On Med List: yes   4. Last Visit with PCP: 7.26.22  5. Next visit date with PCP: n/a   Agent: Please be advised that RX refills may take up to 3 business days. We ask that you follow-up with your pharmacy.

## 2021-06-13 MED ORDER — METHYLPHENIDATE HCL ER (OSM) 36 MG PO TBCR: 36.0000 mg | EXTENDED_RELEASE_TABLET | Freq: Every day | ORAL | 0 refills | Status: DC

## 2021-06-13 NOTE — Telephone Encounter (Signed)
Erx

## 2021-06-21 NOTE — Telephone Encounter (Signed)
Pt mom called in and said the pharmacy stated there is a manufacturing issue and they can not get the medication and pt mom want to know if it can be changed to vyvanse. Vyvanse is in stock. Pt has been out of medication for a week

## 2021-06-26 MED ORDER — LISDEXAMFETAMINE DIMESYLATE 30 MG PO CAPS: 30.0000 mg | ORAL_CAPSULE | Freq: Every day | ORAL | 0 refills | Status: DC

## 2021-06-26 NOTE — Addendum Note (Signed)
Addended by: Ria Bush on: 06/26/2021 04:56 PM   Modules accepted: Orders

## 2021-06-26 NOTE — Telephone Encounter (Signed)
Pt mom called stating that pt is still out of medication. Pt mom states its been 2 wks now. Pt mom states that pt really needs his medication. Please advise.

## 2021-06-26 NOTE — Telephone Encounter (Addendum)
I'm sorry I never received 06/21/2021 message - was not sent to me.   I have sent Vyvanse 30mg  to pharmacy for them to try this month, update [place] with effect.

## 2021-06-27 ENCOUNTER — Telehealth: Payer: Self-pay

## 2021-06-27 DIAGNOSIS — F909 Attention-deficit hyperactivity disorder, unspecified type: Secondary | ICD-10-CM

## 2021-06-27 NOTE — Telephone Encounter (Signed)
I have deleted that, it was duplicate fax. Thank you

## 2021-06-27 NOTE — Telephone Encounter (Signed)
Patient advised. Patient was also advised that we receives notes from the pharmacy that PA is needed and this was submitted and waiting on the decision now. Patient did not have any other questions.

## 2021-06-27 NOTE — Telephone Encounter (Signed)
See other phone note

## 2021-06-27 NOTE — Telephone Encounter (Signed)
PA submitted for Vyvanse through Covermymeds. Pending decision   Key: [id number]  Your information has been submitted to [place]. [employer] [place] will review the request and notify you of the determination decision directly, typically within 72 hours of receiving all information.  You will also receive your request decision electronically. To check for an update later, open this request again from your dashboard.  If [employer] [place] has not responded within the specified timeframe or if you have any questions about your PA submission, contact [employer]

## 2021-06-27 NOTE — Telephone Encounter (Signed)
There is a PA request for vyvanse on S [address] access nurse for this pt; it appears this PA has already been started but please look at and if needed it is there for you and if not needed please delete.thank you. Will also teams Anastasiya CMA.

## 2021-06-27 NOTE — Telephone Encounter (Signed)
Pt mom is returning a call and she said to call the home because the dad and pt will be there. Pt  mom is on her lunch when she calls and she does not get off until 530 so she can not answer the call because she is working with patients.

## 2021-06-27 NOTE — Telephone Encounter (Signed)
PA started for this medication also, see other phone note

## 2021-06-27 NOTE — Telephone Encounter (Signed)
Left message for patient to return call to office at [phone]  

## 2021-07-06 NOTE — Telephone Encounter (Signed)
Received faxed PA denial.  Fyi to Dr. 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  Clinical documentation is required if the member is unable to take generic formulary alternatives.   Alternative meds include: dexmethylphenidate ER caps

## 2021-07-07 MED ORDER — DEXMETHYLPHENIDATE HCL ER 15 MG PO CP24: 15.0000 mg | ORAL_CAPSULE | Freq: Every day | ORAL | 0 refills | Status: DC

## 2021-07-07 NOTE — Telephone Encounter (Addendum)
Please notify PA again denied for Vyvanse.  I've sent in dexmethylphenidate ER caps 20mg  daily to try in place of concerta due to backorder.

## 2021-07-07 NOTE — Addendum Note (Signed)
Addended by: Ria Bush on: 07/07/2021 11:45 AM   Modules accepted: Orders

## 2021-07-10 NOTE — Telephone Encounter (Signed)
Lvm asking pt to call back.  Need to relay Dr. G's message.  

## 2021-07-11 NOTE — Telephone Encounter (Signed)
Called number provided in pt info. It is a landline phone. Person who answered stated pt is at work. I did not leave any info.

## 2021-07-12 ENCOUNTER — Encounter: Payer: Self-pay | Admitting: Family Medicine

## 2021-07-12 NOTE — Telephone Encounter (Signed)
Pt mom called back returning the call,

## 2021-07-12 NOTE — Telephone Encounter (Signed)
Lvm asking pt to call back.  Need to relay Dr. G's message.  

## 2021-07-13 NOTE — Telephone Encounter (Signed)
Lvm asking pt to call back.  Need to relay Dr. G's message.  

## 2021-07-18 NOTE — Telephone Encounter (Signed)
Patient notified as instructed by telephone and verbalized understanding. 

## 2021-08-24 ENCOUNTER — Other Ambulatory Visit: Payer: Self-pay | Admitting: Family Medicine

## 2021-08-24 NOTE — Telephone Encounter (Signed)
Name of Medication: Focalin XR Name of Pharmacy: [place] or Written Date and Quantity: 07/07/21, #30 Last Office Visit and Type: 02/06/21, ADHD Next Office Visit and Type: none Last Controlled Substance Agreement Date: 02/21/18 Last UDS: 02/21/18

## 2021-08-24 NOTE — Telephone Encounter (Signed)
Pt needs a refill on dexmethylphenidate (FOCALIN XR) 15 MG 24 hr capsule sent to [facility]

## 2021-08-24 NOTE — Addendum Note (Signed)
Addended by: Brenton Grills on: 7/86/7672 09:47 PM   Modules accepted: Orders

## 2021-08-25 MED ORDER — DEXMETHYLPHENIDATE HCL ER 15 MG PO CP24: 15.0000 mg | ORAL_CAPSULE | Freq: Every day | ORAL | 0 refills | Status: DC

## 2021-08-25 NOTE — Telephone Encounter (Signed)
ERx 

## 2021-12-14 ENCOUNTER — Encounter: Payer: Self-pay | Admitting: Podiatry

## 2021-12-14 ENCOUNTER — Ambulatory Visit (INDEPENDENT_AMBULATORY_CARE_PROVIDER_SITE_OTHER): Payer: BC Managed Care – PPO | Admitting: Podiatry

## 2021-12-14 ENCOUNTER — Ambulatory Visit (INDEPENDENT_AMBULATORY_CARE_PROVIDER_SITE_OTHER): Payer: BC Managed Care – PPO

## 2021-12-14 DIAGNOSIS — M76821 Posterior tibial tendinitis, right leg: Secondary | ICD-10-CM

## 2021-12-14 DIAGNOSIS — M76822 Posterior tibial tendinitis, left leg: Secondary | ICD-10-CM

## 2021-12-14 DIAGNOSIS — M25571 Pain in right ankle and joints of right foot: Secondary | ICD-10-CM

## 2021-12-14 NOTE — Patient Instructions (Signed)

## 2021-12-16 NOTE — Progress Notes (Signed)
Subjective:   Patient ID: Isaiah Benjamin, male   DOB: 21 y.o.   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  Patient will be seen back when orthotics are returned and we will build up the medial arch bilateral due to the flatness of the patient's feet and I discussed this with mother also  X-rays indicate significant collapse medial longitudinal arch bilateral

## 2021-12-21 ENCOUNTER — Other Ambulatory Visit: Payer: Self-pay | Admitting: Podiatry

## 2021-12-21 DIAGNOSIS — M76821 Posterior tibial tendinitis, right leg: Secondary | ICD-10-CM

## 2022-01-16 ENCOUNTER — Encounter: Payer: Self-pay | Admitting: Family Medicine

## 2022-01-16 ENCOUNTER — Ambulatory Visit: Payer: BC Managed Care – PPO | Admitting: Family Medicine

## 2022-01-16 DIAGNOSIS — W57XXXA Bitten or stung by nonvenomous insect and other nonvenomous arthropods, initial encounter: Secondary | ICD-10-CM | POA: Insufficient documentation

## 2022-01-16 DIAGNOSIS — S70362A Insect bite (nonvenomous), left thigh, initial encounter: Secondary | ICD-10-CM | POA: Insufficient documentation

## 2022-01-16 MED ORDER — DOXYCYCLINE HYCLATE 100 MG PO TABS: 100.0000 mg | ORAL_TABLET | Freq: Two times a day (BID) | ORAL | 0 refills | Status: DC

## 2022-01-16 NOTE — Progress Notes (Signed)
Patient ID: Isaiah Benjamin, male    DOB: 24-Sep-2000, 21 y.o.   MRN: [id number]  This visit was conducted in person.  BP 128/68   Pulse 64   Temp 98.2 F (36.8 C) (Temporal)   Ht '5\' 7"'$  (1.702 m)   Wt 147 lb 2 oz (66.7 kg)   SpO2 97%   BMI 23.04 kg/m    CC: tick bite  Subjective:   HPI: Isaiah Benjamin is a 21 y.o. male presenting on 01/16/2022 for Insect Bite (C/o tick bite on L thigh. Area is red and swollen. Noticed on 01/12/22.  )   DOI: 01/12/2022 Noticed tick bite to L anterior thigh, unsure how long it was attached. Thinks fully removed.   Has a dog.   No fevers/chills, new joint pain, HA, abd pain, nausea, rash.   ADHD - currently off stimulant.     Relevant past medical, surgical, family and social history reviewed and updated as indicated. Interim medical history since our last visit reviewed. Allergies and medications reviewed and updated. Outpatient Medications Prior to Visit  Medication Sig Dispense Refill   cetirizine (ZYRTEC) 10 MG tablet Take 10 mg by mouth daily as needed for allergies.     dexmethylphenidate (FOCALIN XR) 15 MG 24 hr capsule Take 1 capsule (15 mg total) by mouth daily. (Patient not taking: Reported on 01/16/2022) 30 capsule 0   lisdexamfetamine (VYVANSE) 30 MG capsule Take 1 capsule (30 mg total) by mouth daily. (Patient not taking: Reported on 01/16/2022) 30 capsule 0   methylphenidate (CONCERTA) 36 MG PO CR tablet Take 1 tablet (36 mg total) by mouth daily. (Patient not taking: Reported on 01/16/2022) 30 tablet 0   Melatonin 5 MG CAPS Take 1 capsule by mouth daily as needed.     No facility-administered medications prior to visit.     Per HPI unless specifically indicated in ROS section below Review of Systems  Objective:  BP 128/68   Pulse 64   Temp 98.2 F (36.8 C) (Temporal)   Ht '5\' 7"'$  (1.702 m)   Wt 147 lb 2 oz (66.7 kg)   SpO2 97%   BMI 23.04 kg/m   Wt Readings from Last 3 Encounters:  01/16/22 147 lb 2 oz (66.7  kg)  02/06/21 137 lb (62.1 kg)  05/19/20 129 lb (58.5 kg)      Physical Exam Vitals and nursing note reviewed.  Constitutional:      Appearance: Normal appearance. He is not ill-appearing.  Musculoskeletal:        General: Normal range of motion.  Skin:    General: Skin is warm and dry.     Findings: Erythema, lesion and rash present.     Comments: 2 separate bites to left lateral thigh, proximal one with mild surrounding blanching erythema and central dark scab/residual piece-area cleaned with alcohol and removed with 25-gauge needle, patient tolerated well.  Area dressed with antibiotic ointment and Band-Aid.  Home care instructions provided.  Neurological:     Mental Status: He is alert.  Psychiatric:        Mood and Affect: Mood normal.        Behavior: Behavior normal.       Assessment & Plan:   Problem List Items Addressed This Visit     Tick bite of left thigh    Possible residual tick piece removed as above, patient tolerated well. Currently without tickborne illness symptoms-these were extensively reviewed and given possible duration tick attached, I  will provide him with a wait-and-see prescription for doxycycline with indications when to fill.  Reviewed photosensitivity precautions of doxycycline.  Update if not improving with this.        Meds ordered this encounter  Medications   doxycycline (VIBRA-TABS) 100 MG tablet    Sig: Take 1 tablet (100 mg total) by mouth 2 (two) times daily.    Dispense:  20 tablet    Refill:  0   No orders of the defined types were placed in this encounter.    Patient instructions: For tick bite - you have a local reaction to the bite - may use cortizone-10 OTC for itch as needed. If you develop fever, abdominal pain, headache, joint pain, or new rash, fill antibiotic prescription printed out today (doxycycline - caution it can make you sensitive to sunburns).   Follow up plan: Return if symptoms worsen or fail to  improve.  Ria Bush, MD

## 2022-01-16 NOTE — Patient Instructions (Signed)
For tick bite - you have a local reaction to the bite - may use cortizone-10 OTC for itch as needed. If you develop fever, abdominal pain, headache, joint pain, or new rash, fill antibiotic prescription printed out today (doxycycline - caution it can make you sensitive to sunburns).   Tick Bite Information, Adult Ticks are insects that draw blood for food. Most ticks live in shrubs and grassy and wooded areas. They climb onto people and animals that brush against the leaves and grasses that they rest on. Then they bite, attaching themselves to the skin. Most ticks are harmless, but some ticks may carry germs that can spread to a person through a bite and cause a disease. To reduce your risk of getting a disease from a tick bite, make sure you: Take steps to prevent tick bites. Check for ticks after being outdoors where ticks live. Watch for symptoms of disease if a tick attached to you or if you suspect a tick bite. How can I prevent tick bites? Take these steps to help prevent tick bites when you go outdoors in an area where ticks live: Use insect repellent Use insect repellent that has DEET (20% or higher), picaridin, or IR3535 in it. Follow the instructions on the label. Use these products on: Bare skin. The top of your boots. Your pant legs. Your sleeve cuffs. For insect repellent that contains permethrin, follow the instructions on the label. Use these products on: Clothing. Boots. Outdoor gear. Tents. When you are outside Wear protective clothing. Long sleeves and long pants offer the best protection from ticks. Wear light-colored clothing so you can see ticks more easily. Tuck your pant legs into your socks. If you go walking on a trail, stay in the middle of the trail so your skin, hair, and clothing do not touch the bushes. Avoid walking through areas with long grass. Check for ticks on your clothing, hair, and skin often while you are outside, and check again before you go  inside. Make sure to check the scalp, neck, armpits, waist, groin, and joint areas. These are the spots where ticks attach themselves most often. When you go indoors Check your clothing for ticks. Tumble dry clothes in a dryer on high heat for at least 10 minutes. If clothes are damp, additional time may be needed. If clothes require washing, use hot water. Examine gear and pets. Shower soon after being outdoors. Check your body for ticks. Conduct a full body check using a mirror. What is the proper way to remove a tick? If you find a tick on your body, remove it as soon as possible. Removing a tick sooner can prevent germs from passing to your body. Do not remove the tick with your bare fingers. To remove a tick that is crawling on your skin but has not bitten, use either of these methods: Go outdoors and brush the tick off. Remove the tick with tape or a lint roller. To remove a tick that is attached to your skin: Wash your hands. If you have latex gloves, put them on. Use fine-tipped tweezers, curved forceps, or a tick-removal tool to gently grasp the tick as close to your skin and the tick's head as possible. Gently pull with a steady, upward, even pressure until the tick lets go. When removing the tick: Take care to keep the tick's head attached to its body. Do not twist or jerk the tick. This can make the tick's head or mouth parts break off and remain  in the skin. Do not squeeze or crush the tick's body. This could force disease-carrying fluids from the tick into your body. Do not try to remove a tick with heat, alcohol, petroleum jelly, or fingernail polish. Using these methods can cause the tick to salivate and regurgitate into your bloodstream, increasing your risk of getting a disease. What should I do after removing a tick? Dispose of the tick. Do not crush a tick with your fingers. Clean the bite area and your hands with soap and water, rubbing alcohol, or an iodine scrub. If an  antiseptic cream or ointment is available, apply a small amount to the bite site. Wash and disinfect any instruments that you used to remove the tick. How should I dispose of a tick? To dispose of a live tick, use one of these methods: Place it in rubbing alcohol. Place it in a sealed bag or container. Wrap it tightly in tape. Flush it down the toilet. Contact a health care provider if: You have symptoms of a disease after a tick bite. Symptoms of a tick-borne disease can occur from moments after the tick bites to 30 days after a tick is removed. Symptoms include: Fever or chills. Any of these signs in the bite area: A red rash that makes a circle (bull's-eye rash) in the bite area. Redness and swelling. Headache. Muscle, joint, or bone pain. Abnormal tiredness. Numbness in your legs or difficulty walking or moving your legs. Tender, swollen lymph glands. A part of a tick breaks off and gets stuck in your skin. Get help right away if: You are not able to remove a tick. You experience muscle weakness or paralysis. Your symptoms get worse or you experience new symptoms. You find an engorged tick on your skin and you are in an area where disease from ticks is a high risk. Summary Ticks may carry germs that can spread to a person through a bite and cause a disease. Wear protective clothing and use insect repellent to prevent tick bites. Follow the instructions on the label. If you find a tick on your body, remove it as soon as possible. If the tick is attached, do not try to remove with heat, alcohol, petroleum jelly, or fingernail polish. Remove the attached tick using fine-tipped tweezers, curved forceps, or a tick-removal tool. Gently pull with steady, upward, even pressure until the tick lets go. Do not twist or jerk the tick. Do not squeeze or crush the tick's body. If you have symptoms of a disease after being bitten by a tick, contact a health care provider. This information is not  intended to replace advice given to you by your health care provider. Make sure you discuss any questions you have with your health care provider. Document Revised: 06/28/2019 Document Reviewed: 06/28/2019 Elsevier Patient Education  [place].

## 2022-01-16 NOTE — Assessment & Plan Note (Signed)
Possible residual tick piece removed as above, patient tolerated well. Currently without tickborne illness symptoms-these were extensively reviewed and given possible duration tick attached, I will provide him with a wait-and-see prescription for doxycycline with indications when to fill.  Reviewed photosensitivity precautions of doxycycline.  Update if not improving with this.

## 2022-01-29 ENCOUNTER — Ambulatory Visit (INDEPENDENT_AMBULATORY_CARE_PROVIDER_SITE_OTHER): Payer: BC Managed Care – PPO

## 2022-01-29 DIAGNOSIS — M76821 Posterior tibial tendinitis, right leg: Secondary | ICD-10-CM

## 2022-01-29 DIAGNOSIS — M76822 Posterior tibial tendinitis, left leg: Secondary | ICD-10-CM

## 2022-01-29 NOTE — Progress Notes (Signed)
Patient presents today to pick up custom molded foot orthotics, diagnosed with posterior tibialis tendinitis of both lower extremities by Dr. Paulla Dolly.   Orthotics were dispensed and fit was satisfactory. Reviewed instructions for break-in and wear. Written instructions given to patient.  Patient will follow up as needed.   [facility] - order # [id number]

## 2022-03-12 ENCOUNTER — Other Ambulatory Visit: Payer: Self-pay | Admitting: Family Medicine

## 2022-03-12 NOTE — Telephone Encounter (Signed)
  Encourage patient to contact the pharmacy for refills or they can request refills through [facility]  Did the patient contact the pharmacy:  n   LAST APPOINTMENT DATE:  Please schedule appointment if longer than 1 year  NEXT APPOINTMENT DATE:  MEDICATION:dexmethylphenidate (FOCALIN XR) 15 MG 24 hr capsule  Is the patient out of medication? n  If not, how much is left?few days left  Is this a 90 day supply: y  PHARMACY: [place], [place] AT [address] Phone:  [phone]  Fax:  [phone]      Let patient know to contact pharmacy at the end of the day to make sure medication is ready.  Please notify patient to allow 48-72 hours to process

## 2022-03-12 NOTE — Telephone Encounter (Addendum)
 Name of Medication: Focalin  Name of Pharmacy: [facility] Last Fill or Written Date and Quantity: 08/25/21, #30 Last Office Visit and Type: 01/16/22, tick bite; 01/16/22, ADHD f/u Next Office Visit and Type: none Last Controlled Substance Agreement Date: 02/21/18 Last UDS: 02/21/18

## 2022-03-13 MED ORDER — DEXMETHYLPHENIDATE HCL ER 15 MG PO CP24: 15.0000 mg | ORAL_CAPSULE | Freq: Every day | ORAL | 0 refills | Status: DC

## 2022-03-13 NOTE — Telephone Encounter (Signed)
ERx 

## 2022-08-30 ENCOUNTER — Ambulatory Visit (INDEPENDENT_AMBULATORY_CARE_PROVIDER_SITE_OTHER): Payer: BC Managed Care – PPO | Admitting: Family Medicine

## 2022-08-30 ENCOUNTER — Encounter: Payer: Self-pay | Admitting: Family Medicine

## 2022-08-30 VITALS — BP 120/66 | HR 67 | Temp 97.4°F | Ht 67.75 in | Wt 150.1 lb

## 2022-08-30 DIAGNOSIS — F909 Attention-deficit hyperactivity disorder, unspecified type: Secondary | ICD-10-CM | POA: Diagnosis not present

## 2022-08-30 DIAGNOSIS — Z131 Encounter for screening for diabetes mellitus: Secondary | ICD-10-CM | POA: Diagnosis not present

## 2022-08-30 DIAGNOSIS — Z23 Encounter for immunization: Secondary | ICD-10-CM | POA: Diagnosis not present

## 2022-08-30 DIAGNOSIS — Z0000 Encounter for general adult medical examination without abnormal findings: Secondary | ICD-10-CM

## 2022-08-30 DIAGNOSIS — Z13220 Encounter for screening for lipoid disorders: Secondary | ICD-10-CM | POA: Diagnosis not present

## 2022-08-30 DIAGNOSIS — J302 Other seasonal allergic rhinitis: Secondary | ICD-10-CM

## 2022-08-30 LAB — BASIC METABOLIC PANEL
BUN: 16 mg/dL (ref 6–23)
CO2: 31 mEq/L (ref 19–32)
Calcium: 10 mg/dL (ref 8.4–10.5)
Chloride: 102 mEq/L (ref 96–112)
Creatinine, Ser: 0.97 mg/dL (ref 0.40–1.50)
GFR: 110.58 mL/min (ref >60.00)
Glucose, Bld: 87 mg/dL (ref 70–99)
Potassium: 4.7 mEq/L (ref 3.5–5.1)
Sodium: 138 mEq/L (ref 135–145)

## 2022-08-30 LAB — LIPID PANEL
Cholesterol: 161 mg/dL (ref 0–200)
HDL: 47.3 mg/dL (ref >39.00)
LDL Cholesterol: 93 mg/dL (ref 0–99)
NonHDL: 113.39
Total CHOL/HDL Ratio: 3
Triglycerides: 101 mg/dL (ref 0.0–149.0)
VLDL: 20.2 mg/dL (ref 0.0–40.0)

## 2022-08-30 NOTE — Progress Notes (Signed)
Patient ID: Isaiah Benjamin, male    DOB: 2001-01-15, 22 y.o.   MRN: [id number]  This visit was conducted in person.  BP 120/66   Pulse 67   Temp (!) 97.4 F (36.3 C) (Temporal)   Ht 5' 7.75" (1.721 m)   Wt 150 lb 2 oz (68.1 kg)   SpO2 99%   BMI 23.00 kg/m    CC: CPE Subjective:   HPI: Isaiah Benjamin is a 22 y.o. male presenting on 08/30/2022 for Annual Exam   Recently graduated from [employer] with 2 degrees - aviation and [place] (computer numeric control - machining). Currently looking for job.   ADHD - currently off stimulants. Vyvanse was effective but insurance stopped covering. Adderall XL caused ticks. Previously tried focalin XR [id number] and Concerta CR [id number].   Fasting today for labs.   Preventative: Flu shot yearly  Tdap 07/2011, update today.  Seat belt use discussed. No texting and driving Sunscreen use discussed. No changing moles on the skin.  Sleep - averaging 8 hours/night Non smoker Alcohol - none Dentist - q6 mo Eye exam yearly  Not sexually active. Not dating.   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 Interim medical history since our last visit reviewed. Allergies and medications reviewed and updated. Outpatient Medications Prior to Visit  Medication Sig Dispense Refill   cetirizine (ZYRTEC) 10 MG tablet Take 10 mg by mouth daily as needed for allergies.     dexmethylphenidate (FOCALIN XR) 15 MG 24 hr capsule Take 1 capsule (15 mg total) by mouth daily. 30 capsule 0   doxycycline (VIBRA-TABS) 100 MG tablet Take 1 tablet (100 mg total) by mouth 2 (two) times daily. 20 tablet 0   lisdexamfetamine (VYVANSE) 30 MG capsule Take 1 capsule (30 mg total) by mouth daily. 30  capsule 0   methylphenidate (CONCERTA) 36 MG PO CR tablet Take 1 tablet (36 mg total) by mouth daily. 30 tablet 0   No facility-administered medications prior to visit.     Per HPI unless specifically indicated in ROS section below Review of Systems  Constitutional:  Negative for activity change, appetite change, chills, fatigue, fever and unexpected weight change.  HENT:  Negative for hearing loss.   Eyes:  Negative for visual disturbance.  Respiratory:  Negative for cough, chest tightness, shortness of breath and wheezing.   Cardiovascular:  Negative for chest pain, palpitations and leg swelling.  Gastrointestinal:  Positive for abdominal pain (occ discomfort in am). Negative for abdominal distention, blood in stool, constipation, diarrhea, nausea and vomiting.  Genitourinary:  Negative for difficulty urinating and hematuria.  Musculoskeletal:  Negative for arthralgias, myalgias and neck pain.  Skin:  Negative for rash.  Neurological:  Negative for dizziness, seizures, syncope and headaches.  Hematological:  Negative for adenopathy. Does not bruise/bleed easily.  Psychiatric/Behavioral:  Negative for dysphoric mood. The patient is not nervous/anxious.     Objective:  BP 120/66   Pulse 67   Temp (!) 97.4 F (36.3 C) (Temporal)   Ht 5' 7.75" (1.721 m)   Wt 150 lb 2 oz (68.1 kg)   SpO2 99%   BMI 23.00 kg/m   Wt Readings from Last 3 Encounters:  08/30/22 150 lb 2  oz (68.1 kg)  01/16/22 147 lb 2 oz (66.7 kg)  02/06/21 137 lb (62.1 kg)      Physical Exam Vitals and nursing note reviewed.  Constitutional:      General: He is not in acute distress.    Appearance: Normal appearance. He is well-developed. He is not ill-appearing.  HENT:     Head: Normocephalic and atraumatic.     Right Ear: Hearing, tympanic membrane, ear canal and external ear normal.     Left Ear: Hearing, tympanic membrane, ear canal and external ear normal.     Nose: Nose normal.     Mouth/Throat:      Mouth: Mucous membranes are moist.     Pharynx: Oropharynx is clear. No oropharyngeal exudate or posterior oropharyngeal erythema.  Eyes:     General: No scleral icterus.    Extraocular Movements: Extraocular movements intact.     Conjunctiva/sclera: Conjunctivae normal.     Pupils: Pupils are equal, round, and reactive to light.  Neck:     Thyroid: No thyroid mass or thyromegaly.  Cardiovascular:     Rate and Rhythm: Normal rate and regular rhythm.     Pulses: Normal pulses.          Radial pulses are 2+ on the right side and 2+ on the left side.     Heart sounds: Normal heart sounds. No murmur heard. Pulmonary:     Effort: Pulmonary effort is normal. No respiratory distress.     Breath sounds: Normal breath sounds. No wheezing, rhonchi or rales.  Abdominal:     General: Bowel sounds are normal. There is no distension.     Palpations: Abdomen is soft. There is no mass.     Tenderness: There is no abdominal tenderness. There is no guarding or rebound.     Hernia: No hernia is present.  Musculoskeletal:        General: Normal range of motion.     Cervical back: Normal range of motion and neck supple.     Right lower leg: No edema.     Left lower leg: No edema.  Lymphadenopathy:     Cervical: No cervical adenopathy.  Skin:    General: Skin is warm and dry.     Findings: No rash.  Neurological:     General: No focal deficit present.     Mental Status: He is alert and oriented to person, place, and time.  Psychiatric:        Mood and Affect: Mood normal.        Behavior: Behavior normal.        Thought Content: Thought content normal.        Judgment: Judgment normal.           08/30/2022    9:25 AM 02/18/2018   12:53 PM  Depression screen PHQ 2/9  Decreased Interest 0 0  Down, Depressed, Hopeless 0 0  PHQ - 2 Score 0 0       08/30/2022    9:25 AM  GAD 7 : Generalized Anxiety Score  Nervous, Anxious, on Edge 0  Control/stop worrying 0  Worry too much - different  things 0  Trouble relaxing 0  Restless 0  Easily annoyed or irritable 0  Afraid - awful might happen 0  Total GAD 7 Score 0   Assessment & Plan:   Problem List Items Addressed This Visit     Health maintenance examination - Primary (Chronic)    Preventative protocols  reviewed and updated unless pt declined. Discussed healthy diet and lifestyle.       Attention deficit hyperactivity disorder (ADHD)    Stable period off stimulant.       Seasonal allergic rhinitis    Continues zyrtec.       Other Visit Diagnoses     Lipid screening       Relevant Orders   Lipid panel   Diabetes mellitus screening       Relevant Orders   Basic metabolic panel   Need for Tdap vaccination       Relevant Orders   Tdap vaccine greater than or equal to 7yo IM (Completed)        No orders of the defined types were placed in this encounter.   Orders Placed This Encounter  Procedures   Tdap vaccine greater than or equal to 7yo IM   Lipid panel   Basic metabolic panel    Patient Instructions  Update Tdap today.  Labs today.  Good to see you today Return as needed or in 1 year for next physical.   Follow up plan: Return in about 1 year (around 08/31/2023) for annual exam, prior fasting for blood work.  Ria Bush, MD

## 2022-08-30 NOTE — Assessment & Plan Note (Signed)
Preventative protocols reviewed and updated unless pt declined. Discussed healthy diet and lifestyle.  

## 2022-08-30 NOTE — Patient Instructions (Addendum)
Update Tdap today.  Labs today.  Good to see you today Return as needed or in 1 year for next physical.

## 2022-08-30 NOTE — Assessment & Plan Note (Signed)
Continues zyrtec.

## 2022-08-30 NOTE — Assessment & Plan Note (Signed)
Stable period off stimulant.

## 2022-10-07 DIAGNOSIS — Z1283 Encounter for screening for malignant neoplasm of skin: Secondary | ICD-10-CM | POA: Diagnosis not present

## 2022-10-07 DIAGNOSIS — L700 Acne vulgaris: Secondary | ICD-10-CM | POA: Diagnosis not present

## 2022-10-07 DIAGNOSIS — D225 Melanocytic nevi of trunk: Secondary | ICD-10-CM | POA: Diagnosis not present

## 2022-11-12 ENCOUNTER — Encounter: Payer: BC Managed Care – PPO | Admitting: Family Medicine

## 2022-12-02 DIAGNOSIS — L700 Acne vulgaris: Secondary | ICD-10-CM | POA: Diagnosis not present

## 2023-08-24 ENCOUNTER — Other Ambulatory Visit: Payer: Self-pay | Admitting: Family Medicine

## 2023-08-25 ENCOUNTER — Other Ambulatory Visit: Payer: BC Managed Care – PPO

## 2023-09-01 ENCOUNTER — Ambulatory Visit (INDEPENDENT_AMBULATORY_CARE_PROVIDER_SITE_OTHER): Payer: BC Managed Care – PPO | Admitting: Family Medicine

## 2023-09-01 ENCOUNTER — Encounter: Payer: Self-pay | Admitting: Family Medicine

## 2023-09-01 VITALS — BP 136/66 | HR 57 | Temp 98.3°F | Ht 68.0 in | Wt 154.5 lb

## 2023-09-01 DIAGNOSIS — Z0000 Encounter for general adult medical examination without abnormal findings: Secondary | ICD-10-CM

## 2023-09-01 DIAGNOSIS — F909 Attention-deficit hyperactivity disorder, unspecified type: Secondary | ICD-10-CM

## 2023-09-01 MED ORDER — LISDEXAMFETAMINE DIMESYLATE 70 MG PO CAPS: 70.0000 mg | ORAL_CAPSULE | Freq: Every day | ORAL | 0 refills | Status: DC

## 2023-09-01 NOTE — Patient Instructions (Signed)
Retrial Vyvanse 70mg  daily in am - sent to pharmacy Let me know if any trouble with Vyvanse.  Good to see you today  Return as needed or in 1 year for next physical.

## 2023-09-01 NOTE — Assessment & Plan Note (Addendum)
Notes more trouble with performance at work. Desires to restart stimulant - Rx Vyvanse 70mg  daily. Discussed pros and cons of controlled substances and expectations to receive prescription from our office. Patient is not to abuse, misuse, divert or use medication other than as prescribed. Patient is not to seek controlled substances from other clinics or multiple pharmacies. Patient is not to use illegal drugs. Discussed risks of medication including dependence, tolerance, and addiction/abuse potential. Will update controlled substance agreement/urine drug screen as indicated.

## 2023-09-01 NOTE — Assessment & Plan Note (Signed)
 Preventative protocols reviewed and updated unless pt declined. Discussed healthy diet and lifestyle.

## 2023-09-01 NOTE — Progress Notes (Signed)
Ph: [phone] Fax: [phone]   Patient ID: Isaiah Benjamin, male    DOB: 05/26/2001, 23 y.o.   MRN: [id number]  This visit was conducted in person.  BP 136/66   Pulse (!) 57   Temp 98.3 F (36.8 C) (Oral)   Ht 5\' 8"  (1.727 m)   Wt 154 lb 8 oz (70.1 kg)   SpO2 98%   BMI 23.49 kg/m    CC: CPE Subjective:   HPI: Isaiah Benjamin is a 23 y.o. male presenting on 09/01/2023 for Annual Exam (Wants to discuss possibly restarting ADHD med. 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   ADHD - currently off stimulants. Vyvanse was effective but insurance stopped covering. Adderall XL caused ticks. Previously tried focalin XR 15mg  and Concerta CR 36mg . Requests restarting due to his work at [employer] as well as [employer] - forgetful.   Preventative: Flu shot yearly  Tdap 07/2011, 08/2022 Seat belt use discussed. No texting and driving Sunscreen use discussed. No changing moles on the skin.  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 Interim medical history since our last visit reviewed. Allergies and medications reviewed and updated. Outpatient Medications Prior to Visit  Medication Sig Dispense Refill   cetirizine (ZYRTEC) 10 MG tablet Take 10 mg by mouth daily as needed for allergies.     No facility-administered medications prior to visit.     Per HPI unless specifically indicated in ROS section below Review of Systems  Constitutional:  Negative for activity change, appetite change, chills, fatigue, fever and unexpected weight  change.  HENT:  Negative for hearing loss.   Eyes:  Negative for visual disturbance.  Respiratory:  Negative for cough, chest tightness, shortness of breath and wheezing.   Cardiovascular:  Negative for chest pain, palpitations and leg swelling.  Gastrointestinal:  Negative for abdominal distention, abdominal pain, blood in stool, constipation, diarrhea, nausea and vomiting.  Genitourinary:  Negative for difficulty urinating and hematuria.  Musculoskeletal:  Negative for arthralgias, myalgias and neck pain.  Skin:  Negative for rash.  Neurological:  Negative for dizziness, seizures, syncope and headaches.  Hematological:  Negative for adenopathy. Does not bruise/bleed easily.  Psychiatric/Behavioral:  Negative for dysphoric mood. The patient is not nervous/anxious.     Objective:  BP 136/66   Pulse (!) 57   Temp 98.3 F (36.8 C) (Oral)   Ht 5\' 8"  (1.727 m)   Wt 154 lb 8 oz (70.1 kg)   SpO2 98%   BMI 23.49 kg/m   Wt Readings from Last 3 Encounters:  09/01/23 154 lb 8 oz (70.1 kg)  08/30/22 150 lb 2 oz (68.1 kg)  01/16/22 147 lb 2 oz (66.7 kg)      Physical Exam Vitals and nursing note reviewed.  Constitutional:      General: He is not in acute distress.    Appearance: Normal appearance. He is well-developed. He is not ill-appearing.  HENT:     Head: Normocephalic and atraumatic.     Right Ear: Hearing, tympanic membrane, ear canal and  external ear normal.     Left Ear: Hearing, tympanic membrane, ear canal and external ear normal.     Mouth/Throat:     Mouth: Mucous membranes are moist.     Pharynx: Oropharynx is clear. No oropharyngeal exudate or posterior oropharyngeal erythema.  Eyes:     General: No scleral icterus.    Extraocular Movements: Extraocular movements intact.     Conjunctiva/sclera: Conjunctivae normal.     Pupils: Pupils are equal, round, and reactive to light.  Neck:     Thyroid: No thyroid mass or thyromegaly.     Vascular: No carotid bruit.   Cardiovascular:     Rate and Rhythm: Normal rate and regular rhythm.     Pulses: Normal pulses.          Radial pulses are 2+ on the right side and 2+ on the left side.     Heart sounds: Normal heart sounds. No murmur heard. Pulmonary:     Effort: Pulmonary effort is normal. No respiratory distress.     Breath sounds: Normal breath sounds. No wheezing, rhonchi or rales.  Abdominal:     General: Bowel sounds are normal. There is no distension.     Palpations: Abdomen is soft. There is no mass.     Tenderness: There is no abdominal tenderness. There is no guarding or rebound.     Hernia: No hernia is present.  Musculoskeletal:        General: Normal range of motion.     Cervical back: Normal range of motion and neck supple.     Right lower leg: No edema.     Left lower leg: No edema.  Lymphadenopathy:     Cervical: No cervical adenopathy.  Skin:    General: Skin is warm and dry.     Findings: No rash.  Neurological:     General: No focal deficit present.     Mental Status: He is alert and oriented to person, place, and time.  Psychiatric:        Mood and Affect: Mood normal.        Behavior: Behavior normal.        Thought Content: Thought content normal.        Judgment: Judgment normal.       Results for orders placed or performed in visit on 08/30/22  Lipid panel   Collection Time: 08/30/22 10:04 AM  Result Value Ref Range   Cholesterol 161 0 - 200 mg/dL   Triglycerides 536.6 0.0 - 149.0 mg/dL   HDL 44.03 >47.42 mg/dL   VLDL 59.5 0.0 - 63.8 mg/dL   LDL Cholesterol 93 0 - 99 mg/dL   Total CHOL/HDL Ratio 3    NonHDL 113.39   Basic metabolic panel   Collection Time: 08/30/22 10:04 AM  Result Value Ref Range   Sodium 138 135 - 145 mEq/L   Potassium 4.7 3.5 - 5.1 mEq/L   Chloride 102 96 - 112 mEq/L   CO2 31 19 - 32 mEq/L   Glucose, Bld 87 70 - 99 mg/dL   BUN 16 6 - 23 mg/dL   Creatinine, Ser 7.56 0.40 - 1.50 mg/dL   GFR 433.29 >51.88 mL/min   Calcium 10.0 8.4 -  10.5 mg/dL    Assessment & Plan:   Problem List Items Addressed This Visit     Health maintenance examination - Primary (Chronic)   Preventative protocols reviewed and updated unless pt declined. Discussed healthy diet and lifestyle.  Attention deficit hyperactivity disorder (ADHD)   Notes more trouble with performance at work. Desires to restart stimulant - Rx Vyvanse 70mg  daily. Discussed pros and cons of controlled substances and expectations to receive prescription from our office. Patient is not to abuse, misuse, divert or use medication other than as prescribed. Patient is not to seek controlled substances from other clinics or multiple pharmacies. Patient is not to use illegal drugs. Discussed risks of medication including dependence, tolerance, and addiction/abuse potential. Will update controlled substance agreement/urine drug screen as indicated.         Meds ordered this encounter  Medications   lisdexamfetamine (VYVANSE) 70 MG capsule    Sig: Take 1 capsule (70 mg total) by mouth daily.    Dispense:  30 capsule    Refill:  0    No orders of the defined types were placed in this encounter.   Patient Instructions  Retrial Vyvanse 70mg  daily in am - sent to pharmacy Let me know if any trouble with Vyvanse.  Good to see you today  Return as needed or in 1 year for next physical.  Follow up plan: Return in about 1 year (around 08/31/2024) for annual exam, prior fasting for blood work.  Eustaquio Boyden, MD

## 2023-09-04 ENCOUNTER — Other Ambulatory Visit (HOSPITAL_COMMUNITY): Payer: Self-pay

## 2023-09-04 ENCOUNTER — Telehealth: Payer: Self-pay

## 2023-09-04 NOTE — Telephone Encounter (Signed)
Pharmacy Patient Advocate Encounter   Received notification from  [employer]  that prior authorization for Lisdexamfetamine 70MG  caps is required/requested.   Insurance verification completed.   The patient is insured through [facility] ADVANTAGE/RX ADVANCE .   Per test claim: The current 30 day co-pay is, $15.00.  No PA needed at this time. This test claim was processed through [facility]- copay amounts may vary at other pharmacies due to pharmacy/plan contracts, or as the patient moves through the different stages of their insurance plan.

## 2024-02-20 ENCOUNTER — Telehealth: Payer: Self-pay | Admitting: Family Medicine

## 2024-02-20 MED ORDER — LISDEXAMFETAMINE DIMESYLATE 70 MG PO CAPS: 70.0000 mg | ORAL_CAPSULE | Freq: Every day | ORAL | 0 refills | Status: DC

## 2024-02-20 NOTE — Telephone Encounter (Signed)
 Did we need to do something after this was put in chart?

## 2024-02-20 NOTE — Telephone Encounter (Signed)
 Patient brought in new insurance card [employer] card in file

## 2024-02-20 NOTE — Addendum Note (Signed)
 Addended by: RILLA BALLER on: 02/20/2024 04:56 PM   Modules accepted: Orders

## 2024-02-20 NOTE — Telephone Encounter (Addendum)
 I have refilled Vyvanse  which he requested through mom today.  To let us  know if any trouble getting this filled.

## 2024-02-23 NOTE — Telephone Encounter (Unsigned)
 Copied from CRM [id number]. Topic: General - Other >> Feb 23, 2024  1:29 PM Abigail D wrote: Reason for CRM: Patient returning call, no issues with the Vyvanse  - Patient picked it up on Saturday.

## 2024-02-23 NOTE — Telephone Encounter (Signed)
 Lvm asking pt to call back. Plz relay Dr Talmadge message.

## 2024-04-26 ENCOUNTER — Other Ambulatory Visit: Payer: Self-pay | Admitting: Family Medicine

## 2024-04-26 DIAGNOSIS — F909 Attention-deficit hyperactivity disorder, unspecified type: Secondary | ICD-10-CM

## 2024-04-26 NOTE — Telephone Encounter (Signed)
 Copied from CRM [id number]. Topic: Clinical - Medication Refill >> Apr 26, 2024  1:23 PM Suzen RAMAN wrote: Medication: lisdexamfetamine (VYVANSE ) 70 MG capsule   Has the patient contacted their pharmacy? Yes   This is the patient's preferred pharmacy:  [id number] [place], [place] - [address] AT [facility] OF [address] [place] [address] Phone: [phone] Fax: [phone]  Is this the correct pharmacy for this prescription? Yes If no, delete pharmacy and type the correct one.   Has the prescription been filled recently? No  Is the patient out of the medication? Yes  Has the patient been seen for an appointment in the last year OR does the patient have an upcoming appointment? Yes  Can we respond through MyChart? No  Agent: Please be advised that Rx refills may take up to 3 business days. We ask that you follow-up with your pharmacy.

## 2024-04-27 NOTE — Telephone Encounter (Signed)
 Name of Medication:  Vyvanse  Name of Pharmacy:  [facility] Last Fill or Written Date and Quantity:  02/20/24, #30 Last Office Visit and Type:  09/01/23, CPE Next Office Visit and Type:  none Last Controlled Substance Agreement Date:  02/21/18 Last UDS:  02/21/18

## 2024-05-03 MED ORDER — LISDEXAMFETAMINE DIMESYLATE 70 MG PO CAPS: 70.0000 mg | ORAL_CAPSULE | Freq: Every day | ORAL | 0 refills | Status: DC

## 2024-05-03 NOTE — Telephone Encounter (Signed)
 ERx

## 2024-06-15 ENCOUNTER — Other Ambulatory Visit: Payer: Self-pay | Admitting: Family Medicine

## 2024-06-15 DIAGNOSIS — F909 Attention-deficit hyperactivity disorder, unspecified type: Secondary | ICD-10-CM

## 2024-06-15 NOTE — Addendum Note (Signed)
 Addended by: SEBASTIAN DANNA GRADE on: 06/15/2024 05:01 PM   Modules accepted: Orders

## 2024-06-15 NOTE — Telephone Encounter (Signed)
 Name of Medication:  Vyvanse  Name of Pharmacy:  [facility] Last Fill or Written Date and Quantity:  05/03/24, #30 Last Office Visit and Type:  09/01/23, CPE Next Office Visit and Type:  none Last Controlled Substance Agreement Date:  02/21/18 Last UDS:  02/21/18

## 2024-06-15 NOTE — Telephone Encounter (Signed)
 Prescription Request  06/15/2024  LOV: 09/01/2023  What is the name of the medication or equipment? lisdexamfetamine (VYVANSE ) 70 MG capsule   Have you contacted your pharmacy to request a refill? No   Which pharmacy would you like this sent to?  [id number] [place], [place] - [address] AT [facility] OF [address] [place] [address] Phone: [phone] Fax: [phone]    Patient notified that their request is being sent to the clinical staff for review and that they should receive a response within 2 business days.   Please advise at Mobile [phone] (mobile)

## 2024-06-21 MED ORDER — LISDEXAMFETAMINE DIMESYLATE 70 MG PO CAPS: 70.0000 mg | ORAL_CAPSULE | Freq: Every day | ORAL | 0 refills | Status: DC

## 2024-06-21 NOTE — Telephone Encounter (Signed)
 ERx

## 2024-07-29 ENCOUNTER — Telehealth: Payer: Self-pay | Admitting: Family Medicine

## 2024-07-29 NOTE — Telephone Encounter (Signed)
" °  Prescription Request  07/29/2024  LOV: 09/01/2023  What is the name of the medication or equipment? lisdexamfetamine  (VYVANSE ) 70 MG capsule   Have you contacted your pharmacy to request a refill? No   Which pharmacy would you like this sent to?  [id number] [place], [place] - [address] AT [facility] OF [address] [place] [address] Phone: [phone] Fax: [phone]    Patient notified that their request is being sent to the clinical staff for review and that they should receive a response within 2 business days.   Please advise at [facility] [phone]  "

## 2024-07-30 NOTE — Telephone Encounter (Signed)
 Duplicate rx request.

## 2024-08-06 ENCOUNTER — Telehealth: Payer: Self-pay

## 2024-08-06 DIAGNOSIS — F909 Attention-deficit hyperactivity disorder, unspecified type: Secondary | ICD-10-CM

## 2024-08-06 NOTE — Telephone Encounter (Signed)
 Requesting: Vyvanse   Contract: No UDS:  Last Visit: Visit date not found Next Visit: Visit date not found Last Refill: 06/21/24  Please Advise

## 2024-08-06 NOTE — Telephone Encounter (Signed)
 Copied from CRM #[id number]. Topic: Clinical - Prescription Issue >> Aug 06, 2024 11:25 AM Emylou G wrote: Reason for CRM: Patient has been waiting on refill lisdexamfetamine  (VYVANSE ) 70 MG capsule ..initiated 1/15.[facility] Pls review for refill.[id number] - [place], [place] - [address] AT [address] Pls call patient back on update

## 2024-08-09 MED ORDER — LISDEXAMFETAMINE DIMESYLATE 70 MG PO CAPS: 70.0000 mg | ORAL_CAPSULE | Freq: Every day | ORAL | 0 refills | Status: AC

## 2024-08-09 NOTE — Telephone Encounter (Signed)
 Called and left pt VM notifying him of rx refill

## 2024-08-09 NOTE — Telephone Encounter (Addendum)
 Plz notify Vyvanse  was refilled.

## 2024-08-09 NOTE — Addendum Note (Signed)
 Addended by: RILLA BALLER on: 08/09/2024 11:52 AM   Modules accepted: Orders
